# Patient Record
Sex: Male | Born: 1975 | Race: White | Hispanic: Yes | Marital: Married | Smoking: Former smoker
Health system: Southern US, Community
[De-identification: ages and names within clinical notes are randomized; demographics above are authoritative.]

## PROBLEM LIST (undated history)

## (undated) DIAGNOSIS — Z9109 Other allergy status, other than to drugs and biological substances: Secondary | ICD-10-CM

## (undated) DIAGNOSIS — I10 Essential (primary) hypertension: Secondary | ICD-10-CM

## (undated) DIAGNOSIS — T7840XA Allergy, unspecified, initial encounter: Secondary | ICD-10-CM

## (undated) HISTORY — DX: Allergy, unspecified, initial encounter: T78.40XA

## (undated) HISTORY — PX: HERNIA REPAIR: SHX51

## (undated) HISTORY — DX: Essential (primary) hypertension: I10

---

## 2009-06-04 ENCOUNTER — Ambulatory Visit (HOSPITAL_COMMUNITY): Admission: RE | Admit: 2009-06-04 | Discharge: 2009-06-04 | Payer: Self-pay | Admitting: General Surgery

## 2010-10-21 LAB — BASIC METABOLIC PANEL
CO2: 28 mEq/L (ref 19–32)
Chloride: 102 mEq/L (ref 96–112)
Creatinine, Ser: 0.91 mg/dL (ref 0.4–1.5)
GFR calc Af Amer: >60 mL/min (ref >60)
Potassium: 4.1 mEq/L (ref 3.5–5.1)

## 2010-10-21 LAB — CBC
HCT: 47.7 % (ref 39.0–52.0)
MCHC: 35.2 g/dL (ref 30.0–36.0)
MCV: 88.4 fL (ref 78.0–100.0)
RBC: 5.39 MIL/uL (ref 4.22–5.81)
WBC: 6.1 10*3/uL (ref 4.0–10.5)

## 2010-10-21 LAB — DIFFERENTIAL
Basophils Relative: 0 % (ref 0–1)
Eosinophils Absolute: 0.1 10*3/uL (ref 0.0–0.7)
Eosinophils Relative: 2 % (ref 0–5)
Lymphs Abs: 2 10*3/uL (ref 0.7–4.0)
Monocytes Absolute: 0.4 10*3/uL (ref 0.1–1.0)
Monocytes Relative: 6 % (ref 3–12)
Neutrophils Relative %: 60 % (ref 43–77)

## 2011-11-10 ENCOUNTER — Ambulatory Visit: Payer: Self-pay | Admitting: Family Medicine

## 2011-11-10 VITALS — BP 126/73 | HR 67 | Temp 98.5°F | Resp 16 | Ht 71.5 in | Wt 212.0 lb

## 2011-11-10 DIAGNOSIS — M545 Low back pain: Secondary | ICD-10-CM

## 2011-11-10 DIAGNOSIS — R300 Dysuria: Secondary | ICD-10-CM

## 2011-11-10 LAB — POCT UA - MICROSCOPIC ONLY
Bacteria, U Microscopic: NEGATIVE
Crystals, Ur, HPF, POC: NEGATIVE
Epithelial cells, urine per micros: NEGATIVE
RBC, urine, microscopic: NEGATIVE

## 2011-11-10 LAB — POCT URINALYSIS DIPSTICK
Bilirubin, UA: NEGATIVE
Blood, UA: NEGATIVE
Glucose, UA: NEGATIVE
Ketones, UA: NEGATIVE
Spec Grav, UA: 1.01
Urobilinogen, UA: 0.2

## 2011-11-10 MED ORDER — MELOXICAM 7.5 MG PO TABS: 7.5000 mg | ORAL_TABLET | Freq: Two times a day (BID) | ORAL | Status: AC

## 2011-11-10 MED ORDER — DOXYCYCLINE HYCLATE 100 MG PO TABS: 100.0000 mg | ORAL_TABLET | Freq: Two times a day (BID) | ORAL | Status: AC

## 2011-11-10 NOTE — Progress Notes (Signed)
  Subjective:    Patient ID: Seth Harris, male    DOB: [birth year], 36 y.o.   MRN: [id number]  HPI Two week history of lower back pain Pain at end of urine stream Urinating about 1-2 episodes an hour  Denies fever/chills or N/V  No history of nephrolithiasis, prostatitis  Monogamous; denies new sexual partners Denies penile discharge  Review of Systems     Objective:   Physical Exam  Constitutional: He appears well-developed.  Neck: Neck supple.  Cardiovascular: Normal rate, regular rhythm and normal heart sounds.   Pulmonary/Chest: Effort normal and breath sounds normal.  Abdominal: There is tenderness in the suprapubic area. There is no CVA tenderness.  Neurological: He is alert.      Results for orders placed in visit on 11/10/11  POCT URINALYSIS DIPSTICK      Component Value Range   Color, UA yellow     Clarity, UA clear     Glucose, UA neg     Bilirubin, UA neg     Ketones, UA neg     Spec Grav, UA 1.010     Blood, UA neg     pH, UA 5.5     Protein, UA neg     Urobilinogen, UA 0.2     Nitrite, UA neg     Leukocytes, UA Negative    POCT UA - MICROSCOPIC ONLY      Component Value Range   WBC, Ur, HPF, POC 0-1     RBC, urine, microscopic neg     Bacteria, U Microscopic neg     Mucus, UA neg     Epithelial cells, urine per micros neg     Crystals, Ur, HPF, POC neg     Casts, Ur, LPF, POC neg     Yeast, UA neg         Assessment & Plan:   1. Dysuria  POCT urinalysis dipstick, POCT UA - Microscopic Only, doxycycline (VIBRA-TABS) 100 MG tablet  2. LBP (low back pain)  meloxicam (MOBIC) 7.5 MG tablet   Niece provides translation

## 2012-06-17 ENCOUNTER — Ambulatory Visit: Payer: Self-pay | Admitting: Family Medicine

## 2012-06-17 VITALS — BP 126/74 | HR 68 | Temp 98.6°F | Resp 17 | Ht 72.0 in | Wt 210.0 lb

## 2012-06-17 DIAGNOSIS — Z0000 Encounter for general adult medical examination without abnormal findings: Secondary | ICD-10-CM

## 2012-06-17 DIAGNOSIS — R1011 Right upper quadrant pain: Secondary | ICD-10-CM

## 2012-06-17 LAB — LIPID PANEL
Cholesterol: 150 mg/dL (ref 0–200)
HDL: 34 mg/dL — ABNORMAL LOW (ref >39)
LDL Cholesterol: 71 mg/dL (ref 0–99)
Total CHOL/HDL Ratio: 4.4 Ratio
Triglycerides: 223 mg/dL — ABNORMAL HIGH (ref <150)
VLDL: 45 mg/dL — ABNORMAL HIGH (ref 0–40)

## 2012-06-17 LAB — POCT CBC
Granulocyte percent: 58.7 %G (ref 37–80)
HCT, POC: 55.3 % — AB (ref 43.5–53.7)
Hemoglobin: 17.7 g/dL (ref 14.1–18.1)
Lymph, poc: 1.8 (ref 0.6–3.4)
MCH, POC: 29.5 pg (ref 27–31.2)
MCHC: 32 g/dL (ref 31.8–35.4)
MCV: 92.4 fL (ref 80–97)
MID (cbc): 0.3 (ref 0–0.9)
MPV: 10.8 fL (ref 0–99.8)
POC Granulocyte: 3.1 (ref 2–6.9)
POC LYMPH PERCENT: 34.7 %L (ref 10–50)
POC MID %: 6.6 %M (ref 0–12)
Platelet Count, POC: 188 10*3/uL (ref 142–424)
RBC: 5.99 M/uL (ref 4.69–6.13)
RDW, POC: 12.9 %
WBC: 5.3 10*3/uL (ref 4.6–10.2)

## 2012-06-17 LAB — POCT URINALYSIS DIPSTICK
Bilirubin, UA: NEGATIVE
Blood, UA: NEGATIVE
Glucose, UA: NEGATIVE
Ketones, UA: NEGATIVE
Leukocytes, UA: NEGATIVE
Nitrite, UA: NEGATIVE
Protein, UA: NEGATIVE
Spec Grav, UA: 1.025
Urobilinogen, UA: 0.2
pH, UA: 5.5

## 2012-06-17 LAB — COMPREHENSIVE METABOLIC PANEL
ALT: 22 U/L (ref 0–53)
AST: 18 U/L (ref 0–37)
Albumin: 4.9 g/dL (ref 3.5–5.2)
Alkaline Phosphatase: 51 U/L (ref 39–117)
BUN: 12 mg/dL (ref 6–23)
CO2: 28 mEq/L (ref 19–32)
Calcium: 9.6 mg/dL (ref 8.4–10.5)
Chloride: 103 mEq/L (ref 96–112)
Creat: 0.9 mg/dL (ref 0.50–1.35)
Glucose, Bld: 98 mg/dL (ref 70–99)
Potassium: 4.3 mEq/L (ref 3.5–5.3)
Sodium: 138 mEq/L (ref 135–145)
Total Bilirubin: 0.8 mg/dL (ref 0.3–1.2)
Total Protein: 7.8 g/dL (ref 6.0–8.3)

## 2012-06-17 MED ORDER — RANITIDINE HCL 150 MG PO TABS: 150.0000 mg | ORAL_TABLET | Freq: Two times a day (BID) | ORAL | Status: DC

## 2012-06-17 NOTE — Patient Instructions (Addendum)
Colecistitis  (Cholecystitis)  La colecistitis es la inflamacin de la vescula biliar. Generalmente la causa es la formacin de clculos biliares o sedimentos (colelitiasis)) en la vescula. La vescula almacena un lquido que ayuda a digerir las grasas (bilis). La colecistitis es una enfermedad grave y requiere tratamiento inmediato.  CAUSAS   Clculos biliares. Los clculos biliares pueden obstruir el conducto que conduce a la vescula biliar, causando la acumulacin de bilis. Cuando la bilis se acumula, la vescula biliar se inflama.  Problemas en el conducto biliar, como la obstruccin por cicatrizacin o torsin.  Tumores. Los tumores pueden impedir que la bilis salga de la vescula adecuadamente, causando la acumulacin de la misma. Cuando la bilis se acumula, la vescula se inflama. SNTOMAS   Nuseas  Vmitos.  Dolor abdominal, especialmente en la zona superior derecha del abdomen.  Sensibilidad o hinchazn abdominal.  Sudoracin.  Escalofros.  Grant Ruts.  Color amarillo de la piel y en la zona blanca del ojo (ictericia). DIAGNSTICO  Su mdico puede indicar exmenes de sangre para Engineer, manufacturing una infeccin o problemas en la vescula biliar. Tambin puede ordenar pruebas de diagnstico por imgenes, como ecografas o tomografa computada (CT). Otras pruebas pueden incluir un estudio de gammagrafa hepatobiliar con cido iminodiactico (HIDA). Esta exploracin permite a su mdico ver el paso de la bilis desde el hgado hasta la vescula biliar y el intestino delgado.  TRATAMIENTO  La hospitalizacin suele ser necesaria para disminuir la inflamacin de la vescula biliar. Posiblemente le indiquen que no coma ni beba nada (ayuno) durante cierto perodo de [place]. Podrn indicarle un medicamento para calmar el dolor o un antibitico para tratar la infeccin. Puede ser necesario realizar Neomia Dear ciruga para extirpar la vescula biliar (colecistectoma) cuando la inflamacin haya disminudo.  Podra necesitar de inmediato una ciruga si aparecen complicaciones como la muerte del tejido de la vescula biliar (gangrena) o la ruptura (perforacin)) de la vescula biliar.  INSTRUCCIONES PARA EL CUIDADO EN EL HOGAR  El cuidado en el hogar depender del tipo de [place]. En general:   Si le han recetado antibiticos, tmelos segn las indicaciones. Tmelos todos, aunque se sienta mejor.  Solo tome medicamentos de venta libre o recetados para Chief Technology Officer, Dentist o [place], segn las indicaciones del mdico.  Siga una dieta baja en grasas hasta que vuelva a ver al mdico nuevamente.  Cumpla con todas las visitas de control, segn le indique su mdico. SOLICITE ATENCIN MDICA DE INMEDIATO SI:   El dolor aumenta y no puede controlarlo con los medicamentos.  El dolor se traslada hacia alguna otra zona del abdomen o hacia la espalda.  Tiene fiebre.  Tiene nuseas o vmitos. ASEGRESE DE QUE:   Comprende estas instrucciones.  Controlar su enfermedad.  Solicitar ayuda de inmediato si no mejora o si empeora. Document Released: 04/14/2005 Document Revised: 09/27/2011 [facility] Patient Information 2013 [place], [place].

## 2012-06-17 NOTE — Progress Notes (Signed)
@[email or web address] @  Patient ID: Seth Harris MRN: [id number], DOB: 1976-04-01 36 y.o. Date of Encounter: 06/17/2012, 11:10 AM  Primary Physician: No primary provider on file.  Chief Complaint: Physical (CPE)  HPI: 36 y.o. y/o male with history noted below here for CPE.  Doing well.   Review of Systems: Consitutional: No fever, chills, fatigue, night sweats, lymphadenopathy, or weight changes. Eyes: No visual changes, eye redness, or discharge. ENT/Mouth: Ears: No otalgia, tinnitus, hearing loss, discharge. Nose: No congestion, rhinorrhea, sinus pain, or epistaxis. Throat: No sore throat, post nasal drip, or teeth pain. Cardiovascular: No CP, palpitations, diaphoresis, DOE, edema, orthopnea, PND. Respiratory: No cough, hemoptysis, SOB, or wheezing. Gastrointestinal: No anorexia, dysphagia, reflux, nausea, vomiting, hematemesis, diarrhea, constipation, BRBPR, or melena.  He does have 5 days of RUQ pain associated with eating Genitourinary: No dysuria, frequency, urgency, hematuria, incontinence, nocturia, decreased urinary stream, discharge, impotence, or testicular pain/masses. Musculoskeletal: No decreased ROM, myalgias, stiffness, joint swelling, or weakness. Skin: No rash, erythema, lesion changes, pain, warmth, jaundice, or pruritis. Neurological: No headache, dizziness, syncope, seizures, tremors, memory loss, coordination problems, or paresthesias. Psychological: No anxiety, depression, hallucinations, SI/HI. Endocrine: No fatigue, polydipsia, polyphagia, polyuria, or known diabetes. All other systems were reviewed and are otherwise negative.  Past Medical History  Diagnosis Date  . Allergy      Past Surgical History  Procedure Date  . Hernia repair     Home Meds:  Prior to Admission medications   Medication Sig Start Date End Date Taking? Authorizing Provider  meloxicam (MOBIC) 7.5 MG tablet Take 1 tablet (7.5 mg total) by mouth 2 (two) times daily. 11/10/11 11/09/12  Dois Davenport, MD    Allergies: No Known Allergies  History   Social History  . Marital Status: Married    Spouse Name: N/A    Number of Children: N/A  . Years of Education: N/A   Occupational History  . Not on file.   Social History Main Topics  . Smoking status: Former Smoker    Types: Cigarettes    Quit date: 11/09/2001  . Smokeless tobacco: Not on file  . Alcohol Use: No  . Drug Use: No  . Sexually Active: Yes    Birth Control/ Protection: None   Other Topics Concern  . Not on file   Social History Narrative  . No narrative on file    Family History  Problem Relation Age of Onset  . Diabetes Mother   . Diabetes Brother     Physical Exam: Blood pressure 126/74, pulse 68, temperature 98.6 F (37 C), temperature source Oral, resp. rate 17, height 6' (1.829 m), weight 210 lb (95.255 kg), SpO2 99.00%.  General: Well developed, well nourished, in no acute distress. HEENT: Normocephalic, atraumatic. Conjunctiva pink, sclera non-icteric. Pupils 2 mm constricting to 1 mm, round, regular, and equally reactive to light and accomodation. EOMI. Internal auditory canal clear. TMs with good cone of light and without pathology. Nasal mucosa pink. Nares are without discharge. No sinus tenderness. Oral mucosa pink. Dentition good. Pharynx without exudate.   Neck: Supple. Trachea midline. No thyromegaly. Full ROM. No lymphadenopathy. Lungs: Clear to auscultation bilaterally without wheezes, rales, or rhonchi. Breathing is of normal effort and unlabored. Cardiovascular: RRR with S1 S2. No murmurs, rubs, or gallops appreciated. Distal pulses 2+ symmetrically. No carotid or abdominal bruits Abdomen: Soft, non-tender, non-distended with normoactive bowel sounds. No hepatosplenomegaly or masses. No rebound/guarding. No CVA tenderness. Without hernias.   Genitourinary:  uncircumcised male. No penile lesions. Testes  descended bilaterally, and smooth without tenderness or masses.    Musculoskeletal: Full range of motion and 5/5 strength throughout. Without swelling, atrophy, tenderness, crepitus, or warmth. Extremities without clubbing, cyanosis, or edema. Calves supple. Skin: Warm and moist without erythema, ecchymosis, wounds, or rash. Neuro: A+Ox3. CN II-XII grossly intact. Moves all extremities spontaneously. Full sensation throughout. Normal gait. DTR 2+ throughout upper and lower extremities. Finger to nose intact. Psych:  Responds to questions appropriately with a normal affect.   UA:   Assessment/Plan:  36 y.o. y/o  male here for CPE -  Signed, Elvina Sidle, MD 06/17/2012 11:10 AM

## 2012-06-18 ENCOUNTER — Telehealth: Payer: Self-pay

## 2012-06-18 NOTE — Telephone Encounter (Signed)
Pt does not speak english, his brother Elita Quick is calling ion his behalf. The brother states someone called patient, but he does not understand what was said. Please call jose martinez to advise

## 2012-06-19 ENCOUNTER — Telehealth: Payer: Self-pay

## 2012-06-19 MED ORDER — TRAMADOL HCL 50 MG PO TABS: ORAL_TABLET | ORAL | Status: DC

## 2012-06-19 NOTE — Telephone Encounter (Signed)
Sent in Rx and notified pt. 

## 2012-06-19 NOTE — Telephone Encounter (Signed)
Please call in Tramadol 50 #[phone] one tid prn

## 2012-06-19 NOTE — Telephone Encounter (Signed)
Message copied by Johnnette Litter on Mon Jun 19, 2012  2:58 PM ------      Message from: Elvina Sidle      Created: Mon Jun 19, 2012  2:51 PM       Please inform patient of normal result

## 2012-06-19 NOTE — Telephone Encounter (Signed)
Spoke with patients boss(patient doesn't speak english). He is improving some but still has pain would like pain medicine

## 2012-06-19 NOTE — Telephone Encounter (Signed)
See notes under lab results. 

## 2013-02-18 ENCOUNTER — Ambulatory Visit: Payer: Self-pay | Admitting: Family Medicine

## 2013-02-18 VITALS — BP 108/72 | HR 68 | Temp 98.1°F | Resp 16 | Ht 72.0 in | Wt 176.0 lb

## 2013-02-18 DIAGNOSIS — R300 Dysuria: Secondary | ICD-10-CM

## 2013-02-18 LAB — POCT UA - MICROSCOPIC ONLY
Casts, Ur, LPF, POC: NEGATIVE
Crystals, Ur, HPF, POC: NEGATIVE
Mucus, UA: NEGATIVE

## 2013-02-18 LAB — POCT CBC
HCT, POC: 49.5 % (ref 43.5–53.7)
Hemoglobin: 16 g/dL (ref 14.1–18.1)
MCH, POC: 30.8 pg (ref 27–31.2)
MCV: 95.4 fL (ref 80–97)
MPV: 10.4 fL (ref 0–99.8)
RBC: 5.19 M/uL (ref 4.69–6.13)
WBC: 4.8 10*3/uL (ref 4.6–10.2)

## 2013-02-18 LAB — POCT URINALYSIS DIPSTICK
Blood, UA: NEGATIVE
Glucose, UA: NEGATIVE
Spec Grav, UA: <=1.005
Urobilinogen, UA: 0.2

## 2013-02-18 MED ORDER — CIPROFLOXACIN HCL 500 MG PO TABS: 500.0000 mg | ORAL_TABLET | Freq: Two times a day (BID) | ORAL | Status: DC

## 2013-02-18 NOTE — Patient Instructions (Addendum)
We will be in touch with the rest of your labs. Use the antibiotic as directed.  Let me know if you are getting worse.  Your platelets are slightly low- please have a recheck blood count in the next few months.

## 2013-02-18 NOTE — Progress Notes (Addendum)
[facility] [address], [place] [address] [phone]  Date:  02/18/2013   Name:  Seth Harris   DOB:  1976-02-07   MRN:  [id number]  PCP:  No PCP Per Patient    Chief Complaint: Abdominal Pain and Back Pain   History of Present Illness:  Seth Harris is a 37 y.o. very pleasant male patient who presents with the following:  He notes lower abdominal pain and dysuria.  This occurred 2 months ago but resolved by taking some "healthy pills," some sort of OTC supplement.  However the sx returned 3 weeks ago.  He does not have any penile discharge, no nausea or vomiting. He does have an ache in his lower back bilaterally   He has a history of bilateral inguinal hernias and an umbilical hernia- all have been repaired in the past.  He also notes that one of his testicles "has a little extra meat on it" and he is not sure if this is normal.  He is not sure how long this has been going on.   He is otherwise generally healthy.  No current medications, NKDA  There are no active problems to display for this patient.   Past Medical History  Diagnosis Date  . Allergy     Past Surgical History  Procedure Laterality Date  . Hernia repair      History  Substance Use Topics  . Smoking status: Former Smoker    Types: Cigarettes    Quit date: 11/09/2001  . Smokeless tobacco: Not on file  . Alcohol Use: No    Family History  Problem Relation Age of Onset  . Diabetes Mother   . Diabetes Brother     No Known Allergies  Medication list has been reviewed and updated.  Current Outpatient Prescriptions on File Prior to Visit  Medication Sig Dispense Refill  . ranitidine (ZANTAC) 150 MG tablet Take 1 tablet (150 mg total) by mouth 2 (two) times daily.  30 tablet  3  . traMADol (ULTRAM) 50 MG tablet Take one tablet by mouth three times daily as needed for pain.  21 tablet  0   No current facility-administered medications on file prior to visit.    Review  of Systems:  As per HPI- otherwise negative.   Physical Examination: Filed Vitals:   02/18/13 0929  BP: 108/72  Pulse: 68  Temp: 98.1 F (36.7 C)  Resp: 16   Filed Vitals:   02/18/13 0929  Height: 6' (1.829 m)  Weight: 176 lb (79.833 kg)   Body mass index is 23.86 kg/(m^2). Ideal Body Weight: Weight in (lb) to have BMI = 25: 183.9  GEN: WDWN, NAD, Non-toxic, A & O x 3 HEENT: Atraumatic, Normocephalic. Neck supple. No masses, No LAD. Ears and Nose: No external deformity. CV: RRR, No M/G/R. No JVD. No thrill. No extra heart sounds. PULM: CTA B, no wheezes, crackles, rhonchi. No retractions. No resp. distress. No accessory muscle use. ABD: S, NT, ND, +BS. No rebound. No HSM.  Abdomen completely benign at this time EXTR: No c/c/e NEURO Normal gait.  PSYCH: Normally interactive. Conversant. Not depressed or anxious appearing.  Calm demeanor.  GU: normal, no discharge, lesions or tenderness, no current hernia.  I do not detect any testicular abnormality  Rectal: normal, no prostate enlargement or tenderness  Results for orders placed in visit on 02/18/13  POCT CBC      Result Value Range   WBC 4.8  4.6 - 10.2 K/uL   Lymph, poc 1.7  0.6 - 3.4   POC LYMPH PERCENT 36.2  10 - 50 %L   MID (cbc) 0.3  0 - 0.9   POC MID % 5.8  0 - 12 %M   POC Granulocyte 2.8  2 - 6.9   Granulocyte percent 58.0  37 - 80 %G   RBC 5.19  4.69 - 6.13 M/uL   Hemoglobin 16.0  14.1 - 18.1 g/dL   HCT, POC 40.9  81.1 - 53.7 %   MCV 95.4  80 - 97 fL   MCH, POC 30.8  27 - 31.2 pg   MCHC 32.3  31.8 - 35.4 g/dL   RDW, POC 91.4     Platelet Count, POC 125 (*) 142 - 424 K/uL   MPV 10.4  0 - 99.8 fL  POCT UA - MICROSCOPIC ONLY      Result Value Range   WBC, Ur, HPF, POC negative     RBC, urine, microscopic 0-2     Bacteria, U Microscopic trace     Mucus, UA negative     Epithelial cells, urine per micros 0-1     Crystals, Ur, HPF, POC negative     Casts, Ur, LPF, POC negative     Yeast, UA negative     POCT URINALYSIS DIPSTICK      Result Value Range   Color, UA yellow     Clarity, UA clear     Glucose, UA negative     Bilirubin, UA negative     Ketones, UA negative     Spec Grav, UA <=1.005     Blood, UA negative     pH, UA 6.0     Protein, UA negative     Urobilinogen, UA 0.2     Nitrite, UA negative     Leukocytes, UA Negative      Assessment and Plan: Dysuria - Plan: POCT CBC, POCT UA - Microscopic Only, POCT urinalysis dipstick, Urine culture, GC/Chlamydia Probe Amp, ciprofloxacin (CIPRO) 500 MG tablet  Non- specific sx of dysuria for some time, and a testicular concern.  Await genprobe and urine culture, treat with cipro.  Advised pt that I do not note anything abnormal, but if he notes a change we should do a testicular ultrasound.  At this time he declines the ultrasound, but will let me know if he decided to proceed with this.  He is not sure if there is a change, or if he just never noticed the way his testicles felt in the past.    Signed Abbe Amsterdam, MD  8/12- called to check on pt.  LMOM in spanish- labs look ok, please let me know if any problems persist. I will mail a letter with his labs.

## 2013-02-19 LAB — URINE CULTURE: Organism ID, Bacteria: NO GROWTH

## 2013-02-19 LAB — GC/CHLAMYDIA PROBE AMP: CT Probe RNA: NEGATIVE

## 2013-02-27 ENCOUNTER — Encounter: Payer: Self-pay | Admitting: Family Medicine

## 2013-09-04 ENCOUNTER — Ambulatory Visit: Payer: Self-pay

## 2013-09-05 ENCOUNTER — Ambulatory Visit: Payer: Self-pay

## 2013-09-05 ENCOUNTER — Ambulatory Visit: Payer: Self-pay | Admitting: Internal Medicine

## 2013-09-05 ENCOUNTER — Ambulatory Visit: Payer: No Typology Code available for payment source | Attending: Internal Medicine

## 2013-09-05 VITALS — BP 122/70 | HR 73 | Temp 97.9°F | Resp 16 | Ht 71.0 in | Wt 170.4 lb

## 2013-09-05 DIAGNOSIS — M62830 Muscle spasm of back: Secondary | ICD-10-CM

## 2013-09-05 DIAGNOSIS — M7918 Myalgia, other site: Secondary | ICD-10-CM

## 2013-09-05 DIAGNOSIS — M549 Dorsalgia, unspecified: Secondary | ICD-10-CM

## 2013-09-05 DIAGNOSIS — K429 Umbilical hernia without obstruction or gangrene: Secondary | ICD-10-CM

## 2013-09-05 DIAGNOSIS — IMO0001 Reserved for inherently not codable concepts without codable children: Secondary | ICD-10-CM

## 2013-09-05 DIAGNOSIS — M5380 Other specified dorsopathies, site unspecified: Secondary | ICD-10-CM

## 2013-09-05 DIAGNOSIS — K4020 Bilateral inguinal hernia, without obstruction or gangrene, not specified as recurrent: Secondary | ICD-10-CM

## 2013-09-05 LAB — POCT UA - MICROSCOPIC ONLY
BACTERIA, U MICROSCOPIC: NEGATIVE
CASTS, UR, LPF, POC: NEGATIVE
CRYSTALS, UR, HPF, POC: NEGATIVE
MUCUS UA: NEGATIVE
RBC, urine, microscopic: NEGATIVE
WBC, Ur, HPF, POC: NEGATIVE
YEAST UA: NEGATIVE

## 2013-09-05 LAB — POCT URINALYSIS DIPSTICK
Bilirubin, UA: NEGATIVE
GLUCOSE UA: NEGATIVE
Ketones, UA: NEGATIVE
Leukocytes, UA: NEGATIVE
NITRITE UA: NEGATIVE
PH UA: 5.5
PROTEIN UA: NEGATIVE
RBC UA: NEGATIVE
UROBILINOGEN UA: 0.2

## 2013-09-05 MED ORDER — CYCLOBENZAPRINE HCL 10 MG PO TABS: 10.0000 mg | ORAL_TABLET | Freq: Three times a day (TID) | ORAL | Status: DC | PRN

## 2013-09-05 MED ORDER — TRAMADOL HCL 50 MG PO TABS: 50.0000 mg | ORAL_TABLET | Freq: Three times a day (TID) | ORAL | Status: DC | PRN

## 2013-09-05 NOTE — Progress Notes (Signed)
   Subjective:    Patient ID: Seth ForehandMarco Harris, male    DOB: 08/21/1975, 38 y.o.   MRN: [id number]  HPI  Moderately sever Back pain onset 2 weeks ago after work, no known injury, pain 6/10 increases with motion, no radiation to legs but does radiate to both sides of the back , is mostly  Midline in the lower back. Does not lift anything heavy at work. Also c/o some increased pain in bilateral groin hernias which he has had for 7 weeks adn also some increased discomfort in area of an umbilical hernia that he had surgery to repain 18 years ago.   Review of Systems  Musculoskeletal: Positive for back pain and myalgias. Negative for arthralgias, gait problem, joint swelling, neck pain and neck stiffness.  All other systems reviewed and are negative.       Objective:   Physical Exam  Nursing note and vitals reviewed. Constitutional: He is oriented to person, place, and time. He appears well-developed and well-nourished.  HENT:  Head: Normocephalic and atraumatic.  Right Ear: External ear normal.  Left Ear: External ear normal.  Eyes: Conjunctivae and EOM are normal. Pupils are equal, round, and reactive to light.  Neck: Normal range of motion. Neck supple.  Cardiovascular: Normal rate, normal heart sounds and intact distal pulses.   Pulmonary/Chest: Effort normal.  Abdominal: Soft. Bowel sounds are normal.  bilateral easily reducible inguinal hernias below the surgical scars. Small umbilical hernia appreciated. Easily reducible.   Musculoskeletal: He exhibits tenderness.  Tender upper lumbar spine with muscle spasm bilaterally to l1 l2 area. Normal rom of lower extremities. dtr normal neg slr test.  Neurological: He is alert and oriented to person, place, and time. He has normal reflexes.  Skin: Skin is warm and dry.  Psychiatric: He has a normal mood and affect. His behavior is normal. Judgment and thought content normal.   [facility] reading (PRIMARY) by  Dr. Mindi JunkerGottlieb negitive lumbosacral spine  xray.Marland Kitchen.    Results for orders placed in visit on 09/05/13  POCT UA - MICROSCOPIC ONLY      Result Value Ref Range   WBC, Ur, HPF, POC neg     RBC, urine, microscopic neg     Bacteria, U Microscopic neg     Mucus, UA neg     Epithelial cells, urine per micros 0-1     Crystals, Ur, HPF, POC neg     Casts, Ur, LPF, POC neg     Yeast, UA neg    POCT URINALYSIS DIPSTICK      Result Value Ref Range   Color, UA yellow     Clarity, UA clear     Glucose, UA neg     Bilirubin, UA neg     Ketones, UA neg     Spec Grav, UA <=1.005     Blood, UA neg     pH, UA 5.5     Protein, UA neg     Urobilinogen, UA 0.2     Nitrite, UA neg     Leukocytes, UA Negative     vi    Assessment & Plan:  Pt has bilateral inguinal and small umbilical hernia. Does not want to be referred to surgery at this time. The xray of the back is normal will rx wiht analgesics and muscle relaxant for back pain. No heavy lifting .

## 2013-09-05 NOTE — Patient Instructions (Signed)
Ultram as directed for pain. Flexeril as directed for muscle spasm. No lifting more than 20 pounds. Surgical evaluation for repair of the hernias. Any problem or increased pain return to the office.

## 2013-09-20 ENCOUNTER — Encounter: Payer: Self-pay | Admitting: Family Medicine

## 2013-09-20 ENCOUNTER — Ambulatory Visit: Payer: No Typology Code available for payment source | Attending: Family Medicine | Admitting: Family Medicine

## 2013-09-20 VITALS — BP 115/73 | HR 91 | Temp 98.7°F | Resp 14 | Ht 71.0 in | Wt 174.6 lb

## 2013-09-20 DIAGNOSIS — S39012A Strain of muscle, fascia and tendon of lower back, initial encounter: Secondary | ICD-10-CM

## 2013-09-20 DIAGNOSIS — IMO0002 Reserved for concepts with insufficient information to code with codable children: Secondary | ICD-10-CM | POA: Insufficient documentation

## 2013-09-20 DIAGNOSIS — X58XXXA Exposure to other specified factors, initial encounter: Secondary | ICD-10-CM | POA: Insufficient documentation

## 2013-09-20 MED ORDER — MELOXICAM 7.5 MG PO TABS: ORAL_TABLET | ORAL | Status: DC

## 2013-09-20 NOTE — Progress Notes (Signed)
**Note Seth-Identified via Obfuscation** Subjective:     Patient ID: Seth Harris, male   DOB: 04/19/1976, 38 y.o.   MRN: [id number]  HPI Seth Harris is a 38 year old male that presents with back pain. His back pain started 1 month ago. He states he injured his back at work and does repetetive movements that aggravates his pain. His pain starts in the left side of his back and radiates to the left side. He says it is aggravated by walking and he describes the pain as pulling with no relieving factors. It is worse at night and he rates it as a 5-6 on a scale of 0-10. He has been taking ultram and flexeril with no pain relief. No saddle parastheias or l9oss of bladder/bowel fxn.  Review of Systems The patient denies any other issues at this time.    Objective:   Physical Exam Nursing note and vitals reviewed. Constitutional: He is oriented to person, place, and time. He  appears well-developed and well-nourished.  HENT:  Right Ear: External ear normal.  Left Ear: External ear normal.  Nose: Nose normal.  Mouth/Throat: Oropharynx is clear and moist. No oropharyngeal exudate.  Eyes: Conjunctivae are normal. Pupils are equal, round, and reactive to light.  Neck: Normal range of motion. Neck supple. No thyromegaly present.  Cardiovascular: Normal rate, regular rhythm and normal heart sounds.   Pulmonary/Chest: Effort normal and breath sounds normal.  Abdominal: Soft. Bowel sounds are normal.  no distension. There is no tenderness. There is no rebound.  Lymphadenopathy:    He has no cervical adenopathy.  Neurological: He is alert and oriented to person, place, and time. He has normal reflexes.  Skin: Skin is warm and dry.He has no concerning moles or skin lesions Psychiatric: He has a normal mood and affect. His behavior is normal.  msk - SLR neg b/l Low back tight muscles in lumbar area, decreased rom of back.     Assessment:     strain     Plan:       Seth Harris was seen today for establish care.  Diagnoses and associated  orders for this visit:  Back strain - Discontinue: meloxicam (MOBIC) 7.5 MG tablet; 1-2 tabs by mouth daily - meloxicam (MOBIC) 7.5 MG tablet; 1-2 tabs by mouth daily Exercises given. Expect up to 8 weeks to recover. Rtc/ED i,mmediat;y if worsens with red flag sx. F/u as scheduled

## 2013-09-20 NOTE — Progress Notes (Signed)
Patient is here to establish care. Patient feels that her may have bilateral hernia's. Complains of lower back pain from Rt to Lt. Pain scale of 3 today. Was told that he has a muscular pain, pain has not ended. Needs a medication refill. Bitter taste in patient's mouth x2 days. Patient has an interpreter.

## 2013-09-20 NOTE — Patient Instructions (Signed)
Ejercicios para la espalda  (Back Exercises)  Estos ejercicios ayudan a tratar y prevenir lesiones en la espalda. El objetivo es aumentar la fuerza de los músculos abdominales y dorsales y la flexibilidad de la espalda. Debe comenzar con estos ejercicios cuando ya no tenga dolor. Los ejercicios para la espalda incluyen:  · Inclinación de la pelvis - Recuéstese sobre la espalda con las rodillas flexionadas. Incline la pelvis hasta que la parte inferior de la espalda se apoye en el piso. Mantenga esta posición durante 5 a 10 segundos y repita entre 5 y 10 veces.  · Rodilla al pecho  Empuje primero una rodilla contra el pecho y mantenga durante 20 a 30 segundos; repita con la otra rodilla y luego con ambas a la vez. Esto puede realizarlo con la otra pierna extendida o flexionada, del modo en que se sienta más cómodo.  · Abdominales o despegar el cóccix del suelo empleando la musculatura abdominal  Flexione las rodillas 90 grados. Comience inclinando la pelvis y realice un ejercicio abdominal lento y parcial, elevando el tronco sólo entre 30 y 45 grados del suelo. Emplee al menos entre 2 y 3 segundos para cada abdominal. No realice los abdominales con las rodillas extendidas. Si le resulta difícil realizar abdominales parciales, simplemente haga lo que se explicó anteriormente, pero sólo contraiga los músculos abdominales y manténgalos tal como se le ha indicado.  · Inclinación de la cadera - Recuéstese sobre la espalda con las rodillas flexionadas a 90 grados. Empújese con los pies y los hombros mientras eleva la cadera un par de centímetros del suelo, mantenga durante 10 segundos y repita entre 5 y 10 veces.  · Arcos dorsales  Acuéstese sobre el estómago e impulse el tronco hacia atrás sobre los codos flexionados. Presione lentamente con las manos, formando un arco con la zona inferior de la espalda. Repita entre 3 y 5 veces. Al realizar las repeticiones, luego de un tiempo disminuirán la rigidez y las  molestias.  · Elevación de los hombros  Acuéstese hacia abajo con los brazos a los lados del cuerpo. Presione las caderas y el torso contra el suelo mientras eleva lentamente la cabeza y los hombros del suelo.  No exagere con los ejercicios, especialmente en el comienzo. Los ejercicios pueden causar alguna molestia leve en la espalda durante algunos minutos; sin embargo, si el dolor es muy intenso, o dura más de 15 minutos, no siga con la actividad física hasta que consulte al profesional que lo asiste. Los problemas en la espalda mejoran de manera lenta con esta terapia.   Consulte al profesional para que lo ayude a planificar un programa de ejercicios adecuado para su espalda.  Document Released: 07/05/2005 Document Revised: 09/27/2011  [employer]® Patient Information ©2014 [employer].

## 2013-12-21 ENCOUNTER — Ambulatory Visit: Payer: No Typology Code available for payment source | Attending: Internal Medicine | Admitting: Internal Medicine

## 2013-12-21 ENCOUNTER — Encounter: Payer: Self-pay | Admitting: Internal Medicine

## 2013-12-21 VITALS — BP 103/68 | HR 75 | Temp 98.3°F | Resp 16 | Wt 174.8 lb

## 2013-12-21 DIAGNOSIS — R1011 Right upper quadrant pain: Secondary | ICD-10-CM | POA: Insufficient documentation

## 2013-12-21 DIAGNOSIS — Z87898 Personal history of other specified conditions: Secondary | ICD-10-CM | POA: Insufficient documentation

## 2013-12-21 DIAGNOSIS — R1030 Lower abdominal pain, unspecified: Secondary | ICD-10-CM | POA: Insufficient documentation

## 2013-12-21 DIAGNOSIS — Z9889 Other specified postprocedural states: Secondary | ICD-10-CM | POA: Insufficient documentation

## 2013-12-21 DIAGNOSIS — IMO0002 Reserved for concepts with insufficient information to code with codable children: Secondary | ICD-10-CM

## 2013-12-21 DIAGNOSIS — R1909 Other intra-abdominal and pelvic swelling, mass and lump: Secondary | ICD-10-CM

## 2013-12-21 DIAGNOSIS — R1900 Intra-abdominal and pelvic swelling, mass and lump, unspecified site: Secondary | ICD-10-CM

## 2013-12-21 DIAGNOSIS — S39012A Strain of muscle, fascia and tendon of lower back, initial encounter: Secondary | ICD-10-CM | POA: Insufficient documentation

## 2013-12-21 DIAGNOSIS — R109 Unspecified abdominal pain: Secondary | ICD-10-CM | POA: Insufficient documentation

## 2013-12-21 MED ORDER — IBUPROFEN 600 MG PO TABS: 600.0000 mg | ORAL_TABLET | Freq: Three times a day (TID) | ORAL | Status: DC | PRN

## 2013-12-21 MED ORDER — CYCLOBENZAPRINE HCL 10 MG PO TABS: 10.0000 mg | ORAL_TABLET | Freq: Every day | ORAL | Status: DC

## 2013-12-21 NOTE — Progress Notes (Signed)
Here with interpreter Complains of having some back pain Also has bilateral hernias to his groin area that he  re injured at work

## 2013-12-21 NOTE — Progress Notes (Signed)
MRN: [id number] Name: Seth Harris  Sex: male Age: 38 y.o. DOB: 03-05-1976  Allergies: Review of patient's allergies indicates no known allergies.  Chief Complaint  Patient presents with  . Hernia    HPI: Patient is 38 y.o. male who was seen in our office by Dr. Sherral Hammers  3 months ago with symptoms of lower back pain patient was prescribed MOBIC as per patient he did not take his medication because it caused  him problem, patient also has history of bilateral inguinal hernia and reported pain and noticed lump in the groin for the last 2 years, denies any change in bowel habits reported to have right upper pain on and off when he eats and is concerned about gallbladder stones, denies any fever chills nausea vomiting.  Past Medical History  Diagnosis Date  . Allergy     Past Surgical History  Procedure Laterality Date  . Hernia repair        Medication List       This list is accurate as of: 12/21/13 11:16 AM.  Always use your most recent med list.               cyclobenzaprine 10 MG tablet  Commonly known as:  FLEXERIL  Take 1 tablet (10 mg total) by mouth at bedtime.     ibuprofen 600 MG tablet  Commonly known as:  ADVIL,MOTRIN  Take 1 tablet (600 mg total) by mouth every 8 (eight) hours as needed.     meloxicam 7.5 MG tablet  Commonly known as:  MOBIC  1-2 tabs by mouth daily        Meds ordered this encounter  Medications  . cyclobenzaprine (FLEXERIL) 10 MG tablet    Sig: Take 1 tablet (10 mg total) by mouth at bedtime.    Dispense:  30 tablet    Refill:  1  . ibuprofen (ADVIL,MOTRIN) 600 MG tablet    Sig: Take 1 tablet (600 mg total) by mouth every 8 (eight) hours as needed.    Dispense:  30 tablet    Refill:  1     There is no immunization history on file for this patient.  Family History  Problem Relation Age of Onset  . Diabetes Mother   . Diabetes Brother     History  Substance Use Topics  . Smoking status: Former Smoker    Types:  Cigarettes    Quit date: 11/09/2001  . Smokeless tobacco: Not on file  . Alcohol Use: No    Review of Systems   As noted in HPI  Filed Vitals:   12/21/13 1046  BP: 103/68  Pulse: 75  Temp: 98.3 F (36.8 C)  Resp: 16    Physical Exam  Physical Exam  Constitutional: No distress.  Eyes: EOM are normal. Pupils are equal, round, and reactive to light.  Cardiovascular: Normal rate and regular rhythm.   Pulmonary/Chest: Breath sounds normal. No respiratory distress. He has no wheezes. He has no rales.  Abdominal:  Minimal bulge in bilateral inguinal region, old incision noticed    CBC    Component Value Date/Time   WBC 4.8 02/18/2013 1054   WBC 6.1 05/29/2009 1336   RBC 5.19 02/18/2013 1054   RBC 5.39 05/29/2009 1336   HGB 16.0 02/18/2013 1054   HGB 16.8 05/29/2009 1336   HCT 49.5 02/18/2013 1054   HCT 47.7 05/29/2009 1336   PLT 153 05/29/2009 1336   MCV 95.4 02/18/2013 1054   MCV 88.4  05/29/2009 1336   LYMPHSABS 2.0 05/29/2009 1336   MONOABS 0.4 05/29/2009 1336   EOSABS 0.1 05/29/2009 1336   BASOSABS 0.0 05/29/2009 1336    CMP     Component Value Date/Time   NA 138 06/17/2012 1130   K 4.3 06/17/2012 1130   CL 103 06/17/2012 1130   CO2 28 06/17/2012 1130   GLUCOSE 98 06/17/2012 1130   BUN 12 06/17/2012 1130   CREATININE 0.90 06/17/2012 1130   CREATININE 0.91 05/29/2009 1336   CALCIUM 9.6 06/17/2012 1130   PROT 7.8 06/17/2012 1130   ALBUMIN 4.9 06/17/2012 1130   AST 18 06/17/2012 1130   ALT 22 06/17/2012 1130   ALKPHOS 51 06/17/2012 1130   BILITOT 0.8 06/17/2012 1130   GFRNONAA >60 05/29/2009 1336   GFRAA  Value: >60        The eGFR has been calculated using the MDRD equation. This calculation has not been validated in all clinical situations. eGFR's persistently <60 mL/min signify possible Chronic Kidney Disease. 05/29/2009 1336    Lab Results  Component Value Date/Time   CHOL 150 06/17/2012 11:30 AM    No components found with this basename: hga1c     Lab Results  Component Value Date/Time   AST 18 06/17/2012 11:30 AM    Assessment and Plan  Back strain - Plan: I advised patient to apply heating pad trial of cyclobenzaprine (FLEXERIL) 10 MG tablet each bedtime, ibuprofen (ADVIL,MOTRIN) 600 MG tablet  Inguinal pain/Groin lump - Plan: Ambulatory referral to General Surgery for further evaluation  RUQ pain - Plan: US Abdomen Complete  Return in about 3 months (around 03/23/2014).  Lorayne Marek, MD

## 2013-12-27 ENCOUNTER — Ambulatory Visit (HOSPITAL_COMMUNITY)
Admission: RE | Admit: 2013-12-27 | Discharge: 2013-12-27 | Disposition: A | Discharge status: Home / Self Care | Payer: No Typology Code available for payment source | Source: Ambulatory Visit | Attending: Internal Medicine | Admitting: Internal Medicine

## 2013-12-27 ENCOUNTER — Telehealth: Payer: Self-pay

## 2013-12-27 DIAGNOSIS — R1011 Right upper quadrant pain: Principal | ICD-10-CM

## 2013-12-27 DIAGNOSIS — G8929 Other chronic pain: Secondary | ICD-10-CM

## 2013-12-27 DIAGNOSIS — N289 Disorder of kidney and ureter, unspecified: Secondary | ICD-10-CM | POA: Insufficient documentation

## 2013-12-27 NOTE — Telephone Encounter (Signed)
Interpreter line used Patient not available Left message on voice mail to return our call 

## 2013-12-27 NOTE — Telephone Encounter (Signed)
Message copied by Lestine Mount on Thu Dec 27, 2013 12:49 PM ------      Message from: Doris Cheadle      Created: Thu Dec 27, 2013 11:27 AM       Call and let the patient know that his ultrasound abdomen was negative for gallstones. ------

## 2013-12-27 NOTE — Telephone Encounter (Signed)
Patient stopped in office to get his [place] results Results were neg Still having pain Put referral in epic for GI

## 2013-12-31 DIAGNOSIS — K219 Gastro-esophageal reflux disease without esophagitis: Secondary | ICD-10-CM | POA: Insufficient documentation

## 2014-03-27 ENCOUNTER — Encounter: Payer: Self-pay | Admitting: Internal Medicine

## 2014-03-27 ENCOUNTER — Ambulatory Visit: Payer: Self-pay | Attending: Internal Medicine | Admitting: Internal Medicine

## 2014-03-27 VITALS — BP 124/74 | HR 66 | Temp 98.9°F | Ht 72.0 in | Wt 183.4 lb

## 2014-03-27 DIAGNOSIS — K3189 Other diseases of stomach and duodenum: Secondary | ICD-10-CM | POA: Insufficient documentation

## 2014-03-27 DIAGNOSIS — R1013 Epigastric pain: Secondary | ICD-10-CM | POA: Insufficient documentation

## 2014-03-27 DIAGNOSIS — M545 Low back pain, unspecified: Secondary | ICD-10-CM | POA: Insufficient documentation

## 2014-03-27 DIAGNOSIS — M62830 Muscle spasm of back: Secondary | ICD-10-CM

## 2014-03-27 DIAGNOSIS — M5380 Other specified dorsopathies, site unspecified: Secondary | ICD-10-CM | POA: Insufficient documentation

## 2014-03-27 DIAGNOSIS — Z0000 Encounter for general adult medical examination without abnormal findings: Secondary | ICD-10-CM | POA: Insufficient documentation

## 2014-03-27 DIAGNOSIS — Z87891 Personal history of nicotine dependence: Secondary | ICD-10-CM | POA: Insufficient documentation

## 2014-03-27 LAB — CBC WITH DIFFERENTIAL/PLATELET
Basophils Absolute: 0 10*3/uL (ref 0.0–0.1)
Basophils Relative: 0 % (ref 0–1)
EOS ABS: 0.1 10*3/uL (ref 0.0–0.7)
Eosinophils Relative: 2 % (ref 0–5)
HCT: 45.6 % (ref 39.0–52.0)
HEMOGLOBIN: 16.4 g/dL (ref 13.0–17.0)
LYMPHS ABS: 1.3 10*3/uL (ref 0.7–4.0)
Lymphocytes Relative: 34 % (ref 12–46)
MCH: 30.8 pg (ref 26.0–34.0)
MCHC: 36 g/dL (ref 30.0–36.0)
MCV: 85.6 fL (ref 78.0–100.0)
Monocytes Absolute: 0.4 10*3/uL (ref 0.1–1.0)
Monocytes Relative: 10 % (ref 3–12)
NEUTROS ABS: 2.1 10*3/uL (ref 1.7–7.7)
NEUTROS PCT: 54 % (ref 43–77)
PLATELETS: 118 10*3/uL — AB (ref 150–400)
RBC: 5.33 MIL/uL (ref 4.22–5.81)
RDW: 13.2 % (ref 11.5–15.5)
WBC: 3.9 10*3/uL — AB (ref 4.0–10.5)

## 2014-03-27 MED ORDER — OMEPRAZOLE 20 MG PO CPDR: 20.0000 mg | DELAYED_RELEASE_CAPSULE | Freq: Every day | ORAL | Status: DC

## 2014-03-27 MED ORDER — CYCLOBENZAPRINE HCL 10 MG PO TABS: 10.0000 mg | ORAL_TABLET | Freq: Every day | ORAL | Status: DC

## 2014-03-27 MED ORDER — IBUPROFEN 600 MG PO TABS: 600.0000 mg | ORAL_TABLET | Freq: Three times a day (TID) | ORAL | Status: DC | PRN

## 2014-03-27 NOTE — Progress Notes (Signed)
Patient presents today for annual exam. Patient is no longer taking prescribed medication for pain, does take some Ibuprofen when needed. Patient does have lower right back pain that right now is 5/10 pain. This pain has been present since 07/2013

## 2014-03-27 NOTE — Progress Notes (Signed)
Patient Demographics  Seth Harris, is a 38 y.o. male  [id number]:[id number]  [id number]  DOB - March 02, 1976  CC:  Chief Complaint  Patient presents with  . Annual Exam       HPI: Seth Harris is a 38 y.o. male here today for Annual physical examination. He has History low back pain, he had x-ray done in the past which was negative for fracture or or  disc disease, he is requesting refill on his medications sometimes he takes ibuprofen has is Flexeril at bedtime, patient also reported to have some epigastric burning sensation, he already had abdominal ultrasound done which was negative for gallstones, occasionally has some reflux symptoms denies any nausea vomiting change in bowel habits. Patient has No headache, No chest pain, No abdominal pain - No Nausea, No new weakness tingling or numbness, No Cough - SOB.  No Known Allergies Past Medical History  Diagnosis Date  . Allergy    No current outpatient prescriptions on file prior to visit.   No current facility-administered medications on file prior to visit.   Family History  Problem Relation Age of Onset  . Diabetes Mother   . Diabetes Brother    History   Social History  . Marital Status: Married    Spouse Name: N/A    Number of Children: N/A  . Years of Education: N/A   Occupational History  . Not on file.   Social History Main Topics  . Smoking status: Former Smoker    Types: Cigarettes    Quit date: 11/09/2001  . Smokeless tobacco: Not on file  . Alcohol Use: No  . Drug Use: No  . Sexual Activity: Yes    Birth Control/ Protection: None   Other Topics Concern  . Not on file   Social History Narrative  . No narrative on file    Review of Systems: Constitutional: Negative for fever, chills, diaphoresis, activity change, appetite change and fatigue. HENT: Negative for ear pain, nosebleeds, congestion, facial swelling, rhinorrhea, neck pain, neck stiffness and ear discharge.  Eyes: Negative for  pain, discharge, redness, itching and visual disturbance. Respiratory: Negative for cough, choking, chest tightness, shortness of breath, wheezing and stridor.  Cardiovascular: Negative for chest pain, palpitations and leg swelling. Gastrointestinal: Negative for abdominal distention. Genitourinary: Negative for dysuria, urgency, frequency, hematuria, flank pain, decreased urine volume, difficulty urinating and dyspareunia.  Musculoskeletal: Negative for back pain, joint swelling, arthralgia and gait problem. Neurological: Negative for dizziness, tremors, seizures, syncope, facial asymmetry, speech difficulty, weakness, light-headedness, numbness and headaches.  Hematological: Negative for adenopathy. Does not bruise/bleed easily. Psychiatric/Behavioral: Negative for hallucinations, behavioral problems, confusion, dysphoric mood, decreased concentration and agitation.    Objective:   Filed Vitals:   03/27/14 1047  BP: 124/74  Pulse: 66  Temp: 98.9 F (37.2 C)    Physical Exam: Constitutional: Patient appears well-developed and well-nourished. No distress. HENT: Normocephalic, atraumatic, External right and left ear normal. Oropharynx is clear and moist.  Eyes: Conjunctivae and EOM are normal. PERRLA, no scleral icterus. Neck: Normal ROM. Neck supple. No JVD. No tracheal deviation. No thyromegaly. CVS: RRR, S1/S2 +, no murmurs, no gallops, no carotid bruit.  Pulmonary: Effort and breath sounds normal, no stridor, rhonchi, wheezes, rales.  Abdominal: Soft. BS +, no distension, tenderness, rebound or guarding.  Musculoskeletal: Normal range of motion. No edema and no tenderness.  Neuro: Alert. Normal reflexes, muscle tone coordination. No cranial nerve deficit. Skin: Skin is warm and dry. No rash noted. Not  diaphoretic. No erythema. No pallor. Psychiatric: Normal mood and affect. Behavior, judgment, thought content normal.  Lab Results  Component Value Date   WBC 4.8 02/18/2013   HGB  16.0 02/18/2013   HCT 49.5 02/18/2013   MCV 95.4 02/18/2013   PLT 153 05/29/2009   Lab Results  Component Value Date   CREATININE 0.90 06/17/2012   BUN 12 06/17/2012   NA 138 06/17/2012   K 4.3 06/17/2012   CL 103 06/17/2012   CO2 28 06/17/2012    No results found for this basename: HGBA1C   Lipid Panel     Component Value Date/Time   CHOL 150 06/17/2012 1130   TRIG 223* 06/17/2012 1130   HDL 34* 06/17/2012 1130   CHOLHDL 4.4 06/17/2012 1130   VLDL 45* 06/17/2012 1130   LDLCALC 71 06/17/2012 1130       Assessment and plan:   1. Annual physical exam We'll do baseline blood work. - COMPLETE METABOLIC PANEL WITH GFR - Vit D  25 hydroxy (rtn osteoporosis monitoring) - TSH - CBC with Differential  2. Back muscle spasm Advise patient to apply heating pad, medication refill done. - cyclobenzaprine (FLEXERIL) 10 MG tablet; Take 1 tablet (10 mg total) by mouth at bedtime.  Dispense: 30 tablet; Refill: 3 - ibuprofen (ADVIL,MOTRIN) 600 MG tablet; Take 1 tablet (600 mg total) by mouth every 8 (eight) hours as needed.  Dispense: 30 tablet; Refill: 1  3. Dyspepsia Advised patient for last modification, trial of Prilosec. - omeprazole (PRILOSEC) 20 MG capsule; Take 1 capsule (20 mg total) by mouth daily.  Dispense: 30 capsule; Refill: 3   Return in about 6 months (around 09/25/2014), or if symptoms worsen or fail to improve, for back pain.  Doris Cheadle, MD

## 2014-03-27 NOTE — Progress Notes (Deleted)
Patient Demographics  Seth Harris, is a 38 y.o. male  ZOX:[id number]  [id number]  DOB - 1975-12-07  CC:  Chief Complaint  Patient presents with  . Annual Exam       HPI: Seth Harris is a 38 y.o. male here today to establish medical care. Patient has No headache, No chest pain, No abdominal pain - No Nausea, No new weakness tingling or numbness, No Cough - SOB.  No Known Allergies Past Medical History  Diagnosis Date  . Allergy    Current Outpatient Prescriptions on File Prior to Visit  Medication Sig Dispense Refill  . ibuprofen (ADVIL,MOTRIN) 600 MG tablet Take 1 tablet (600 mg total) by mouth every 8 (eight) hours as needed.  30 tablet  1   No current facility-administered medications on file prior to visit.   Family History  Problem Relation Age of Onset  . Diabetes Mother   . Diabetes Brother    History   Social History  . Marital Status: Married    Spouse Name: N/A    Number of Children: N/A  . Years of Education: N/A   Occupational History  . Not on file.   Social History Main Topics  . Smoking status: Former Smoker    Types: Cigarettes    Quit date: 11/09/2001  . Smokeless tobacco: Not on file  . Alcohol Use: No  . Drug Use: No  . Sexual Activity: Yes    Birth Control/ Protection: None   Other Topics Concern  . Not on file   Social History Narrative  . No narrative on file    Review of Systems: Constitutional: Negative for fever, chills, diaphoresis, activity change, appetite change and fatigue. HENT: Negative for ear pain, nosebleeds, congestion, facial swelling, rhinorrhea, neck pain, neck stiffness and ear discharge.  Eyes: Negative for pain, discharge, redness, itching and visual disturbance. Respiratory: Negative for cough, choking, chest tightness, shortness of breath, wheezing and stridor.  Cardiovascular: Negative for chest pain, palpitations and leg swelling. Gastrointestinal: Negative for abdominal  distention. Genitourinary: Negative for dysuria, urgency, frequency, hematuria, flank pain, decreased urine volume, difficulty urinating and dyspareunia.  Musculoskeletal: Negative for back pain, joint swelling, arthralgia and gait problem. Neurological: Negative for dizziness, tremors, seizures, syncope, facial asymmetry, speech difficulty, weakness, light-headedness, numbness and headaches.  Hematological: Negative for adenopathy. Does not bruise/bleed easily. Psychiatric/Behavioral: Negative for hallucinations, behavioral problems, confusion, dysphoric mood, decreased concentration and agitation.    Objective:   Filed Vitals:   03/27/14 1047  BP: 124/74  Pulse: 66  Temp: 98.9 F (37.2 C)    Physical Exam: Constitutional: Patient appears well-developed and well-nourished. No distress. HENT: Normocephalic, atraumatic, External right and left ear normal. Oropharynx is clear and moist.  Eyes: Conjunctivae and EOM are normal. PERRLA, no scleral icterus. Neck: Normal ROM. Neck supple. No JVD. No tracheal deviation. No thyromegaly. CVS: RRR, S1/S2 +, no murmurs, no gallops, no carotid bruit.  Pulmonary: Effort and breath sounds normal, no stridor, rhonchi, wheezes, rales.  Abdominal: Soft. BS +, no distension, tenderness, rebound or guarding.  Musculoskeletal: Normal range of motion. No edema and no tenderness.  Lymphadenopathy: No lymphadenopathy noted, cervical, inguinal or axillary Neuro: Alert. Normal reflexes, muscle tone coordination. No cranial nerve deficit. Skin: Skin is warm and dry. No rash noted. Not diaphoretic. No erythema. No pallor. Psychiatric: Normal mood and affect. Behavior, judgment, thought content normal.  Lab Results  Component Value Date   WBC 4.8 02/18/2013   HGB 16.0 02/18/2013  HCT 49.5 02/18/2013   MCV 95.4 02/18/2013   PLT 153 05/29/2009   Lab Results  Component Value Date   CREATININE 0.90 06/17/2012   BUN 12 06/17/2012   NA 138 06/17/2012   K 4.3  06/17/2012   CL 103 06/17/2012   CO2 28 06/17/2012    No results found for this basename: HGBA1C   Lipid Panel     Component Value Date/Time   CHOL 150 06/17/2012 1130   TRIG 223* 06/17/2012 1130   HDL 34* 06/17/2012 1130   CHOLHDL 4.4 06/17/2012 1130   VLDL 45* 06/17/2012 1130   LDLCALC 71 06/17/2012 1130       Assessment and plan:   1. Annual physical exam ***  2. Back muscle spasm ***  3. Dyspepsia ***      Return in about 6 months (around 09/25/2014), or if symptoms worsen or fail to improve, for back pain.     Doris Cheadle, MD

## 2014-03-28 LAB — COMPLETE METABOLIC PANEL WITH GFR
ALBUMIN: 4.8 g/dL (ref 3.5–5.2)
ALK PHOS: 50 U/L (ref 39–117)
ALT: 27 U/L (ref 0–53)
AST: 27 U/L (ref 0–37)
BILIRUBIN TOTAL: 0.6 mg/dL (ref 0.2–1.2)
BUN: 15 mg/dL (ref 6–23)
CO2: 29 mEq/L (ref 19–32)
CREATININE: 0.92 mg/dL (ref 0.50–1.35)
Calcium: 10 mg/dL (ref 8.4–10.5)
Chloride: 101 mEq/L (ref 96–112)
GFR, Est African American: >89 mL/min
GLUCOSE: 100 mg/dL — AB (ref 70–99)
Potassium: 4.9 mEq/L (ref 3.5–5.3)
Sodium: 140 mEq/L (ref 135–145)
Total Protein: 7.6 g/dL (ref 6.0–8.3)

## 2014-03-28 LAB — TSH: TSH: 1.572 u[IU]/mL (ref 0.350–4.500)

## 2014-03-28 LAB — VITAMIN D 25 HYDROXY (VIT D DEFICIENCY, FRACTURES): VIT D 25 HYDROXY: 22 ng/mL — AB (ref 30–89)

## 2014-04-05 ENCOUNTER — Other Ambulatory Visit: Payer: Self-pay | Admitting: *Deleted

## 2014-04-05 ENCOUNTER — Telehealth: Payer: Self-pay | Admitting: *Deleted

## 2014-04-05 DIAGNOSIS — E559 Vitamin D deficiency, unspecified: Secondary | ICD-10-CM

## 2014-04-05 MED ORDER — VITAMIN D (ERGOCALCIFEROL) 1.25 MG (50000 UNIT) PO CAPS: 50000.0000 [IU] | ORAL_CAPSULE | ORAL | Status: DC

## 2014-04-05 NOTE — Telephone Encounter (Signed)
Left message to return call 

## 2014-04-05 NOTE — Telephone Encounter (Signed)
Message copied by Dyann Kief on Fri Apr 05, 2014 11:10 AM ------      Message from: Doris Cheadle      Created: Thu Mar 28, 2014  9:44 AM       Blood work reviewed, noticed low vitamin D, call patient advise to start ergocalciferol 50,000 units once a week for the duration of  12 weeks.      Blood work reviewed noticed impaired fasting glucose, call and advise patient for low carbohydrate diet.       ------

## 2014-04-05 NOTE — Telephone Encounter (Signed)
Pt aware of lab results, Rx ergocalciferol e-scribe to our pharmacy

## 2014-04-05 NOTE — Telephone Encounter (Signed)
Message copied by Dyann Kief on Fri Apr 05, 2014 10:33 AM ------      Message from: Doris Cheadle      Created: Thu Mar 28, 2014  9:44 AM       Blood work reviewed, noticed low vitamin D, call patient advise to start ergocalciferol 50,000 units once a week for the duration of  12 weeks.      Blood work reviewed noticed impaired fasting glucose, call and advise patient for low carbohydrate diet.       ------

## 2014-04-25 ENCOUNTER — Telehealth: Payer: Self-pay | Admitting: Emergency Medicine

## 2014-04-25 NOTE — Telephone Encounter (Signed)
Left message with pt blood results and instructions to start taking Vitamin D 50,000 units daily x 12 weeks [facility] Spanish interpretor available

## 2014-04-26 ENCOUNTER — Ambulatory Visit: Payer: Self-pay

## 2014-05-03 ENCOUNTER — Ambulatory Visit: Payer: Self-pay | Attending: Internal Medicine

## 2014-05-06 DIAGNOSIS — Z8719 Personal history of other diseases of the digestive system: Secondary | ICD-10-CM | POA: Insufficient documentation

## 2014-05-06 DIAGNOSIS — Z9889 Other specified postprocedural states: Secondary | ICD-10-CM

## 2014-05-22 DIAGNOSIS — K4020 Bilateral inguinal hernia, without obstruction or gangrene, not specified as recurrent: Secondary | ICD-10-CM | POA: Insufficient documentation

## 2014-06-17 ENCOUNTER — Telehealth: Payer: Self-pay | Admitting: Internal Medicine

## 2014-06-17 NOTE — Telephone Encounter (Signed)
Pt. Came into facility to request a letter giving him clearance for work, pt. 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 [place] 04[place]2014 [place] [facility]. Pt would like to have letter by the end of the week in order for pt. To start working again.Please f/u with pt.

## 2014-06-17 NOTE — Telephone Encounter (Signed)
Left voice message need appointment with PCP for clearance.

## 2014-06-25 ENCOUNTER — Ambulatory Visit: Payer: Self-pay | Attending: Internal Medicine | Admitting: Internal Medicine

## 2014-06-25 ENCOUNTER — Encounter: Payer: Self-pay | Admitting: Internal Medicine

## 2014-06-25 VITALS — BP 120/77 | HR 70 | Temp 98.0°F | Resp 16 | Wt 187.8 lb

## 2014-06-25 DIAGNOSIS — Z8719 Personal history of other diseases of the digestive system: Secondary | ICD-10-CM

## 2014-06-25 DIAGNOSIS — Z9889 Other specified postprocedural states: Secondary | ICD-10-CM

## 2014-06-25 DIAGNOSIS — R1013 Epigastric pain: Secondary | ICD-10-CM | POA: Insufficient documentation

## 2014-06-25 NOTE — Progress Notes (Signed)
MRN: [id number] Name: Seth Harris  Sex: male Age: 38 y.o. DOB: 08/11/1975  Allergies: Review of patient's allergies indicates no known allergies.  Chief Complaint  Patient presents with  . Follow-up    HPI: Patient is 38 y.o. male who has to of GERD/dyspepsia her currently taking Prilosec, as per patient he had her hernia repair done in the month of October, patient is requesting the latter regarding returning to work and if any restrictions, patient was advised to have a followup with his surgeon for further evaluation and the recommendation regarding cleared  to go back to work, otherwise he denies any acute symptoms.  Past Medical History  Diagnosis Date  . Allergy     Past Surgical History  Procedure Laterality Date  . Hernia repair        Medication List       This list is accurate as of: 06/25/14 10:40 AM.  Always use your most recent med list.               cyclobenzaprine 10 MG tablet  Commonly known as:  FLEXERIL  Take 1 tablet (10 mg total) by mouth at bedtime.     ibuprofen 600 MG tablet  Commonly known as:  ADVIL,MOTRIN  Take 1 tablet (600 mg total) by mouth every 8 (eight) hours as needed.     omeprazole 20 MG capsule  Commonly known as:  PRILOSEC  Take 1 capsule (20 mg total) by mouth daily.     Vitamin D (Ergocalciferol) 50000 UNITS Caps capsule  Commonly known as:  DRISDOL  Take 1 capsule (50,000 Units total) by mouth every 7 (seven) days.        No orders of the defined types were placed in this encounter.     There is no immunization history on file for this patient.  Family History  Problem Relation Age of Onset  . Diabetes Mother   . Diabetes Brother     History  Substance Use Topics  . Smoking status: Former Smoker    Types: Cigarettes    Quit date: 11/09/2001  . Smokeless tobacco: Not on file  . Alcohol Use: No    Review of Systems   As noted in HPI  Filed Vitals:   06/25/14 1013  BP: 120/77  Pulse: 70    Temp: 98 F (36.7 C)  Resp: 16    Physical Exam  Physical Exam  Constitutional: No distress.  Eyes: EOM are normal. Pupils are equal, round, and reactive to light.  Cardiovascular: Normal rate and regular rhythm.   Pulmonary/Chest: Breath sounds normal. No respiratory distress. He has no wheezes. He has no rales.  Musculoskeletal: He exhibits no edema.    CBC    Component Value Date/Time   WBC 3.9* 03/27/2014 1135   WBC 4.8 02/18/2013 1054   RBC 5.33 03/27/2014 1135   RBC 5.19 02/18/2013 1054   HGB 16.4 03/27/2014 1135   HGB 16.0 02/18/2013 1054   HCT 45.6 03/27/2014 1135   HCT 49.5 02/18/2013 1054   PLT 118* 03/27/2014 1135   MCV 85.6 03/27/2014 1135   MCV 95.4 02/18/2013 1054   LYMPHSABS 1.3 03/27/2014 1135   MONOABS 0.4 03/27/2014 1135   EOSABS 0.1 03/27/2014 1135   BASOSABS 0.0 03/27/2014 1135    CMP     Component Value Date/Time   NA 140 03/27/2014 1135   K 4.9 03/27/2014 1135   CL 101 03/27/2014 1135   CO2 29 03/27/2014 1135  GLUCOSE 100* 03/27/2014 1135   BUN 15 03/27/2014 1135   CREATININE 0.92 03/27/2014 1135   CREATININE 0.91 05/29/2009 1336   CALCIUM 10.0 03/27/2014 1135   PROT 7.6 03/27/2014 1135   ALBUMIN 4.8 03/27/2014 1135   AST 27 03/27/2014 1135   ALT 27 03/27/2014 1135   ALKPHOS 50 03/27/2014 1135   BILITOT 0.6 03/27/2014 1135   GFRNONAA >89 03/27/2014 1135   GFRNONAA >60 05/29/2009 1336   GFRAA >89 03/27/2014 1135   GFRAA  05/29/2009 1336    >60        The eGFR has been calculated using the MDRD equation. This calculation has not been validated in all clinical situations. eGFR's persistently <60 mL/min signify possible Chronic Kidney Disease.    Lab Results  Component Value Date/Time   CHOL 150 06/17/2012 11:30 AM    No components found for: HGA1C  Lab Results  Component Value Date/Time   AST 27 03/27/2014 11:35 AM    Assessment and Plan  Dyspepsia Symptoms are stable continue with Prilosec.  History of hernia  surgery Patient to follow with his surgeon and needs a letter to return to work  Lorayne Marek, MD

## 2014-06-25 NOTE — Progress Notes (Signed)
Patient here with interpreter Patient here for medical clearance from surgery to return to work Patient was explained that the surgeon has to do his medical clearance  In order for him to return back to work with any restrictions

## 2014-10-10 ENCOUNTER — Ambulatory Visit: Payer: Self-pay | Attending: Internal Medicine

## 2014-10-31 ENCOUNTER — Encounter: Payer: Self-pay | Admitting: Physician Assistant

## 2014-10-31 ENCOUNTER — Ambulatory Visit: Payer: Self-pay | Attending: Internal Medicine | Admitting: Physician Assistant

## 2014-10-31 VITALS — BP 117/66 | HR 99 | Temp 102.8°F | Resp 18 | Ht 72.0 in | Wt 193.8 lb

## 2014-10-31 DIAGNOSIS — R5081 Fever presenting with conditions classified elsewhere: Secondary | ICD-10-CM

## 2014-10-31 MED ORDER — AZITHROMYCIN 250 MG PO TABS: ORAL_TABLET | ORAL | Status: DC

## 2014-10-31 MED ORDER — ACETAMINOPHEN 500 MG PO TABS
1000.0000 mg | ORAL_TABLET | Freq: Once | ORAL | Status: AC
  Administered 2014-10-31: 1000 mg via ORAL

## 2014-10-31 NOTE — Progress Notes (Signed)
Patient reports neck, shoulders, and back aching, chills and goose bumps. Patient feels like "I just want to close my eyes". Symptoms started Tuesday afternoon with mild symptoms, yesterday was mild, today was worse. Patient took Nyquil and slept last night.

## 2014-10-31 NOTE — Progress Notes (Signed)
Chief Complaint: Fever and cold sxs  Subjective: This is a 39 year old male presenting with 24-36 hours of facial and nasal pain. He's also had achy this in his neck and shoulders and back. He had to leave work on yesterday early secondary to his symptoms. He's had a fever as well. He has a little cough, little phlegm reduction and some fatigue. He denies shortness of breath. He denies runny nose. He denies headache. He denies dizziness. He does have a little bit of a sore throat. He denies nausea.   ROS:  GEN: fever, no chills, denies change in weight HEENT: + headache,+ earache,no  epistaxis, +sore throat, + neck pain LUNGS: denies SHOB, dyspnea, PND, orthopnea CV: denies CP or palpitations   Objective:  Filed Vitals:   10/31/14 1449  BP: 117/66  Pulse: 99  Temp: 102.8 F (39.3 C)  TempSrc: Oral  Resp: 18  Height: 6' (1.829 m)  Weight: 193 lb 12.8 oz (87.907 kg)  SpO2: 99%    Physical Exam:  General: in no acute distress. HEENT: Ears are injected, no drainage. Eyes are irritated. Throat is injected without exudate. No lymphadenopathy. No facial tenderness. Heart: Normal  s1 &s2  Regular rate and rhythm, without murmurs, rubs, gallops. Lungs: Clear to auscultation bilaterally.  Pertinent Lab Results:none   Medications: Prior to Admission medications   Medication Sig Start Date End Date Taking? Authorizing Provider  azithromycin (ZITHROMAX) 250 MG tablet URI 10/31/14   Vivianne Masteriffany S Danille Oppedisano, PA-C  cyclobenzaprine (FLEXERIL) 10 MG tablet Take 1 tablet (10 mg total) by mouth at bedtime. 03/27/14   Doris Cheadleeepak Advani, MD  ibuprofen (ADVIL,MOTRIN) 600 MG tablet Take 1 tablet (600 mg total) by mouth every 8 (eight) hours as needed. 03/27/14   Doris Cheadleeepak Advani, MD  omeprazole (PRILOSEC) 20 MG capsule Take 1 capsule (20 mg total) by mouth daily. 03/27/14   Doris Cheadleeepak Advani, MD  Vitamin D, Ergocalciferol, (DRISDOL) 50000 UNITS CAPS capsule Take 1 capsule (50,000 Units total) by mouth every 7 (seven)  days. 04/05/14   Doris Cheadleeepak Advani, MD    Assessment: 1. URI: infectious vs viral  Plan: Reassurance Tylenol or Ibuprofen OTC for fever as needed Z-pack Work note-may return on Monday 11/04/14  Follow up:as scheduled  The patient was given clear instructions to go to ER or return to medical center on Monday if symptoms don't improve, worsen or new problems develop. The patient verbalized understanding. The patient was told to call to get lab results if they haven't heard anything in the next week.   This note has been created with Education officer, environmentalDragon speech recognition software and smart phrase technology. Any transcriptional errors are unintentional.   Scot Juniffany Shanele Nissan, PA-C 10/31/2014, 3:13 PM

## 2014-10-31 NOTE — Patient Instructions (Signed)
Take Tylenol or ibuprofen every 6-8 hours with meals to reduce her fever Take antibiotic once daily for 5 days No work until Monday Call us on Monday if your symptoms have not improved

## 2015-04-16 ENCOUNTER — Ambulatory Visit: Payer: Self-pay | Attending: Family Medicine

## 2015-08-31 ENCOUNTER — Ambulatory Visit (INDEPENDENT_AMBULATORY_CARE_PROVIDER_SITE_OTHER): Payer: Self-pay | Admitting: Family Medicine

## 2015-08-31 VITALS — BP 128/80 | HR 81 | Temp 98.3°F | Resp 18 | Ht 72.0 in | Wt 209.6 lb

## 2015-08-31 DIAGNOSIS — Z131 Encounter for screening for diabetes mellitus: Secondary | ICD-10-CM

## 2015-08-31 DIAGNOSIS — M545 Low back pain, unspecified: Secondary | ICD-10-CM

## 2015-08-31 DIAGNOSIS — Z789 Other specified health status: Secondary | ICD-10-CM

## 2015-08-31 DIAGNOSIS — Z23 Encounter for immunization: Secondary | ICD-10-CM

## 2015-08-31 DIAGNOSIS — N410 Acute prostatitis: Secondary | ICD-10-CM

## 2015-08-31 DIAGNOSIS — Z13220 Encounter for screening for lipoid disorders: Secondary | ICD-10-CM

## 2015-08-31 DIAGNOSIS — Z0000 Encounter for general adult medical examination without abnormal findings: Secondary | ICD-10-CM

## 2015-08-31 LAB — COMPLETE METABOLIC PANEL WITH GFR
ALT: 30 U/L (ref 9–46)
AST: 24 U/L (ref 10–40)
Albumin: 4.6 g/dL (ref 3.6–5.1)
Alkaline Phosphatase: 54 U/L (ref 40–115)
BILIRUBIN TOTAL: 0.7 mg/dL (ref 0.2–1.2)
BUN: 14 mg/dL (ref 7–25)
CALCIUM: 9.4 mg/dL (ref 8.6–10.3)
CHLORIDE: 103 mmol/L (ref 98–110)
CO2: 28 mmol/L (ref 20–31)
CREATININE: 0.83 mg/dL (ref 0.60–1.35)
GFR, Est Non African American: >89 mL/min (ref >=60)
Glucose, Bld: 106 mg/dL — ABNORMAL HIGH (ref 65–99)
Potassium: 4.5 mmol/L (ref 3.5–5.3)
Sodium: 139 mmol/L (ref 135–146)
TOTAL PROTEIN: 7.8 g/dL (ref 6.1–8.1)

## 2015-08-31 LAB — POCT URINALYSIS DIP (MANUAL ENTRY)
Bilirubin, UA: NEGATIVE
Glucose, UA: NEGATIVE
Ketones, POC UA: NEGATIVE
Leukocytes, UA: NEGATIVE
NITRITE UA: NEGATIVE
PH UA: 6.5
PROTEIN UA: NEGATIVE
RBC UA: NEGATIVE
SPEC GRAV UA: 1.015
UROBILINOGEN UA: 0.2

## 2015-08-31 LAB — POCT CBC
GRANULOCYTE PERCENT: 61 % (ref 37–80)
HCT, POC: 49.5 % (ref 43.5–53.7)
HEMOGLOBIN: 17 g/dL (ref 14.1–18.1)
Lymph, poc: 1.7 (ref 0.6–3.4)
MCH: 30.3 pg (ref 27–31.2)
MCHC: 34.4 g/dL (ref 31.8–35.4)
MCV: 88.1 fL (ref 80–97)
MID (CBC): 0.3 (ref 0–0.9)
MPV: 7.7 fL (ref 0–99.8)
PLATELET COUNT, POC: 141 10*3/uL — AB (ref 142–424)
POC Granulocyte: 3 (ref 2–6.9)
POC LYMPH PERCENT: 33.4 %L (ref 10–50)
POC MID %: 5.6 %M (ref 0–12)
RBC: 5.62 M/uL (ref 4.69–6.13)
RDW, POC: 13.4 %
WBC: 5 10*3/uL (ref 4.6–10.2)

## 2015-08-31 LAB — LIPID PANEL
Cholesterol: 146 mg/dL (ref 125–200)
HDL: 37 mg/dL — ABNORMAL LOW (ref >=40)
LDL CALC: 82 mg/dL (ref <130)
Total CHOL/HDL Ratio: 3.9 Ratio (ref <=5.0)
Triglycerides: 133 mg/dL (ref <150)
VLDL: 27 mg/dL (ref <30)

## 2015-08-31 LAB — POC MICROSCOPIC URINALYSIS (UMFC): Mucus: ABSENT

## 2015-08-31 MED ORDER — CIPROFLOXACIN HCL 500 MG PO TABS: 500.0000 mg | ORAL_TABLET | Freq: Two times a day (BID) | ORAL | Status: DC

## 2015-08-31 NOTE — Patient Instructions (Addendum)
Antibiotic 2 veces cada dia. resulta de laboratorios en 7-10 dias.  regrese si no esta mejor en una semana, mas temproano si empeorse.   Tylenol o motrin si necesario por su espalda. si dolor remanece en 4-6 semanas - regrese hablar mas de esta problema. Mas temprano si empeorse.   Prostatitis (Prostatitis) La prstata tiene aproximadamente el tamao y la forma de Sharee Pimple. Se ubica justo debajo de la vejiga. Produce uno de los componentes del semen, formado por los espermatozoides y los lquidos que ayudan a nutrirlos y transportarlos fuera de los testculos. La prostatitis es una inflamacin de la prstata.  Hay cuatro tipos de prostatitis: 1. Prostatitis bacteriana aguda. Es el tipo menos frecuente de prostatitis. Comienza rpidamente y, por lo general, est acompaada por una infeccin de la vejiga, fiebre alta y escalofros. Puede ocurrir a Actuary. 2. Prostatitis bacteriana crnica. Es una infeccin bacteriana persistente en la prstata. Generalmente aparece luego de una prostatitis bacteriana aguda que se repite o que no fue tratada adecuadamente. Se puede presentar en hombres de cualquier edad pero es ms comn en los hombres de mediana edad cuya prstata ha comenzado a Government social research officer. Los sntomas no son tan graves como los de la prostatitis [place]. La principal [employer] ser en la parte del cuerpo que est delante del recto y debajo del escroto (perineo), en la parte baja del abdomen o en la punta del pene (glande).  3. Prostatitis crnica (no bacteriana). Es el tipo ms frecuente de prostatitis. Es la inflamacin de la prstata y no se origina por una infeccin bacteriana. Se desconoce su causa y puede estar asociada con infecciones virales o trastornos autoinmunitarios. 4. Prostatodinia (trastorno del suelo plvico). Est asociada con un aumento del tono muscular en la pelvis que rodea a la prstata. CAUSAS La causa de la prostatitis bacteriana es una infeccin bacteriana.  Se desconocen las causas de los otros tipos de prostatitis.  SNTOMAS  Los sntomas pueden variar segn el tipo de prostatitis. Tambin puede ocurrir que coincidan sntomas de diferentes tipos de prostatitis. Los sntomas posibles de cada tipo de prostatitis se enumeran a continuacin. Prostatitis bacteriana aguda 1. Dolor al Beatrix Shipper. 2. Grant Ruts o escalofros. 3. Dolor muscular o en las articulaciones. 4. Dolor lumbar. 5. Dolor en la zona inferior del abdomen. 6. Imposibilidad de vaciar la vejiga completamente. Prostatitis bacteriana crnica, prostatitis crnica no bacteriana y prostatodinia 1. Necesidad urgente y repentina de Geographical information systems officer. 2. Ganas de orinar con frecuencia. 3. Dificultad para comenzar a eliminar la orina. 4. Chorro de orina dbil. 5. Secrecin por la uretra. 6. Goteo al terminar Brita Romp. 7. Dolor rectal. 8. Dolor en los testculos, el pene o la punta del pene. 9. Dolor en el perineo. 10. Problemas con la funcin sexual. 11. Eyaculacin dolorosa. 12. Semen con sangre. DIAGNSTICO  Su mdico le preguntar acerca de sus sntomas para hacer el diagnstico de prostatitis. Se recolectarn y analizarn una o ms muestras de [place] (anlisis de [place]). Si el resultado del anlisis de [place] es negativo para bacterias, su mdico podr palpar su prstata con un dedo (examen dgito rectal). Este examen ayuda a su mdico a determinar si la prstata est inflamada y sensible. Tambin se obtendr Lauris Poag de semen para analizarla. TRATAMIENTO  El tratamiento de la prostatitis depende de la causa. Si la causa es una infeccin [place], se puede tratar con antibiticos. En los casos de prostatitis bacteriana crnica, puede ser necesario el uso de antibiticos durante hasta o 6semanas. Su mdico  puede indicarle que tome baos de asiento para ayudar a Engineer, materials. Un bao de asiento es un bao de agua caliente en la que las caderas y las nalgas estn bajo el agua. Esto relaja los  msculos del suelo plvico y, a menudo, contribuye a Technical sales engineer presin en la prstata. INSTRUCCIONES PARA EL CUIDADO EN EL HOGAR   Tome todos los medicamentos como le indic el mdico.  Tome baos de asiento segn las indicaciones del mdico. SOLICITE ATENCIN MDICA SI:   Los sntomas empeoran en lugar de mejorar.  Tiene fiebre. SOLICITE ATENCIN MDICA DE INMEDIATO SI:   Tiene escalofros.  Siente nuseas o vomita.  Se siente mareado o se desmaya.  No puede orinar.  Tiene sangre o cogulos de sangre en la orina. ASEGRESE DE QUE:  Comprende estas instrucciones.  Controlar su afeccin.  Recibir ayuda de inmediato si no mejora o si empeora.   Esta informacin no tiene Theme park manager el consejo del mdico. Asegrese de hacerle al mdico cualquier pregunta que tenga.   Document Released: 04/14/2005 Document Revised: 07/26/2014 Elsevier Interactive Patient Education 2016 [employer].  Dolor de espalda en adultos (Back Pain, Adult) El dolor de espalda es muy frecuente en los adultos.La causa del dolor de espalda es rara vez peligrosa y Chief Technology Officer a menudo mejora con el [place].Es posible que se desconozca la causa de esta afeccin. Algunas causas comunes son las siguientes: 5. Distensin de los msculos o ligamentos que sostienen la columna vertebral. 6. Desgaste (degeneracin) de los discos vertebrales. 7. Artritis. 8. Lesiones directas en la espalda. En [employer], el dolor de espalda es recurrente. Como rara vez es peligroso, las personas pueden aprender a Psychologist, clinical afeccin por s mismas. INSTRUCCIONES PARA EL CUIDADO EN EL HOGAR Controle su dolor de espalda a fin de Public house manager cambio. Las siguientes indicaciones ayudarn a Paramedic cualquier molestia que pueda sentir: 7. Permanezca activo. Si permanece sentado o de pie en un mismo lugar durante mucho tiempo, se tensiona la espalda. No se siente, conduzca o permanezca de pie en un mismo lugar durante  ms de 30 minutos seguidos. Realice caminatas cortas en superficies planas tan pronto como le sea posible.Trate de caminar un poco ms de Pharmacist, community. 8. Haga ejercicio regularmente como se lo haya indicado el mdico. El ejercicio ayuda a que su espalda se cure ms rpidamente. Tambin ayuda a prevenir futuras lesiones al [employer] fuertes y flexibles. 9. No permanezca en la cama.Si hace reposo ms de 1 a 2 das, puede demorar su recuperacin. 10. Preste atencin a su cuerpo al inclinarse y levantarse. Las posiciones ms cmodas son las que ejercen menos tensin en la espalda en recuperacin. Siempre use tcnicas apropiadas para levantar objetos, como por ejemplo: 1. Flexionar las rodillas. 2. Mantener la carga cerca del cuerpo. 3. No torcerse. 11. Encuentre una posicin cmoda para dormir. Use un colchn firme y recustese de costado con las rodillas ligeramente flexionadas. Si se recuesta [employer], coloque una almohada debajo de las rodillas. 12. Evite sentir ansiedad o estrs.El estrs aumenta la tensin muscular y puede empeorar el dolor de espalda.Es importante reconocer si se siente ansioso o estresado y aprender maneras de controlarlo, por ejemplo haciendo ejercicio. 13. Tome los medicamentos solamente como se lo haya indicado el mdico. Los medicamentos de venta libre para Engineer, materials y la inflamacin a menudo son los ms eficaces.El mdico puede recetarle relajantes musculares.Estos medicamentos ayudan a Primary school teacher de modo que  pueda reanudar ms rpidamente sus actividades normales y el ejercicio saludable. 14. Aplique hielo sobre la zona lesionada. 1. Ponga el hielo en una bolsa plstica. 2. Coloque una toalla entre la piel y la bolsa de hielo. 3. Deje el hielo durante , 2 a 3veces por da, durante los primeros 2 o 3das. Despus de eso, puede alternar el hielo y el calor para reducir Chief Technology Officer y los espasmos. 15. Mantenga un peso saludable.  El exceso de peso ejerce presin adicional sobre la espalda y hace que resulte difcil mantener una buena [place]. SOLICITE ATENCIN MDICA SI: 13. Siente un dolor que no se alivia con reposo o medicamentos. 14. Siente mucho dolor que se extiende a las piernas o los glteos. 15. El dolor no mejora en una semana. 16. Siente dolor por la noche. 17. Pierde peso. 18. Siente escalofros o fiebre. SOLICITE ATENCIN MDICA DE INMEDIATO SI:   Tiene nuevos problemas para controlar la vejiga o los intestinos.  Siente debilidad o adormecimiento inusuales en los brazos o en las piernas.  Siente nuseas o vmitos.  Siente dolor abdominal.  Siente que va a desmayarse.   Esta informacin no tiene Theme park manager el consejo del mdico. Asegrese de hacerle al mdico cualquier pregunta que tenga.   Document Released: 07/05/2005 Document Revised: 07/26/2014 Elsevier Interactive Patient Education [employer]   Keeping you healthy  Get these tests  Blood pressure- Have your blood pressure checked once a year by your healthcare provider.  Normal blood pressure is 120/80.  Weight- Have your body mass index (BMI) calculated to screen for obesity.  BMI is a measure of body fat based on height and weight. You can also calculate your own BMI at https://www.west-esparza.com/.  Cholesterol- Have your cholesterol checked regularly starting at age 45, sooner may be necessary if you have diabetes, high blood pressure, if a family member developed heart diseases at an early age or if you smoke.   Chlamydia, HIV, and other sexual transmitted disease- Get screened each year until the age of 51 then within three months of each new sexual partner.  Diabetes- Have your blood sugar checked regularly if you have high blood pressure, high cholesterol, a family history of diabetes or if you are overweight.  Get these vaccines  Flu shot- Every fall.  Tetanus shot- Every 10 years.  Menactra- Single dose;  prevents meningitis.  Take these steps  Don't smoke- If you do smoke, ask your healthcare provider about quitting. For tips on how to quit, go to www.smokefree.gov or call 1-800-QUIT-NOW.  Be physically active- Exercise 5 days a week for at least 30 minutes.  If you are not already physically active start slow and gradually work up to 30 minutes of moderate physical activity.  Examples of moderate activity include walking briskly, mowing the yard, dancing, swimming bicycling, etc.  Eat a healthy diet- Eat a variety of healthy foods such as fruits, vegetables, low fat milk, low fat cheese, yogurt, lean meats, poultry, fish, beans, tofu, etc.  For more information on healthy eating, go to www.thenutritionsource.org  Drink alcohol in moderation- Limit alcohol intake two drinks or less a day.  Never drink and drive.  Dentist- Brush and floss teeth twice daily; visit your dentis twice a year.  Depression-Your emotional health is as important as your physical health.  If you're feeling down, losing interest in things you normally enjoy please talk with your healthcare provider.  Gun Safety- If you keep a gun in your home, keep  it unloaded and with the safety lock on.  Bullets should be stored separately.  Helmet use- Always wear a helmet when riding a motorcycle, bicycle, rollerblading or skateboarding.  Safe sex- If you may be exposed to a sexually transmitted infection, use a condom  Seat belts- Seat bels can save your life; always wear one.  Smoke/Carbon Monoxide detectors- These detectors need to be installed on the appropriate level of your home.  Replace batteries at least once a year.  Skin Cancer- When out in the sun, cover up and use sunscreen SPF 15 or higher.  Violence- If anyone is threatening or hurting you, please tell your healthcare provider.

## 2015-08-31 NOTE — Progress Notes (Addendum)
Subjective:  By signing my name below, I, [name], attest that this documentation has been prepared under the direction and in the presence of [name], MD.  Electronically Signed: [name], ED Scribe. 08/31/2015. 9:20 AM.   Patient ID: Seth Harris, male    DOB: May 29, 1976, 40 y.o.   MRN: [id number]  HPI Chief Complaint  Patient presents with  . Annual Exam    prostate check    HPI Comments: Seth Harris is a 40 y.o. male who presents to the [facility] for a physical and testicular pain.   He has hx of hernia and GERD. Last seen here for acute issues in 2015.  Family hx of DM in brother in mother.  Pt c/o discomfort and slight itching in area between testicles and anus for 1 week. He reports left sided back pain and trouble urinating, feeling a sensation of discharge when urinating but there is not. He mentions some dribbling after urniating about 1 month ago. He had been with same partner for 10 years. He denies new sexual partners. He has noticed a few bumps on his penis over the past year. He has tried natural supplements, multivitamins with omega 3 but no OTC medication. He denies family hx of prostate cancer  Cancer screening Prostate cancer screening- will do this today based on current symptoms  Immunizations   TDAP- he is unsure of last tetanus.  Flu shot- he agrees to flu shot today.   Depression  Depression screen [facility] 2/9 08/31/2015 03/27/2014 09/20/2013  Decreased Interest 0 0 0  Down, Depressed, Hopeless 0 0 0  PHQ - 2 Score 0 0 0   He denies being depressed.    Vision   Visual Acuity Screening   Right eye Left eye Both eyes  Without correction: 20/25 20/25 20/20   With correction:      He has not been seen by optho.   Exercise  He does no exercise regularly at this time.  He reports some low back pain with walking. He will follow up with this at a later visit.   Patient Active Problem List   Diagnosis Date Noted  .  Dyspepsia 03/27/2014  . Inguinal pain 12/21/2013  . Back strain 12/21/2013  . RUQ pain 12/21/2013   Past Medical History  Diagnosis Date  . Allergy    Past Surgical History  Procedure Laterality Date  . Hernia repair     No Known Allergies Prior to Admission medications   Not on File   Social History   Social History  . Marital Status: Married    Spouse Name: N/A  . Number of Children: N/A  . Years of Education: N/A   Occupational History  . Not on file.   Social History Main Topics  . Smoking status: Former Smoker    Types: Cigarettes    Quit date: 11/09/2001  . Smokeless tobacco: Not on file  . Alcohol Use: No  . Drug Use: No  . Sexual Activity: Yes    Birth Control/ Protection: None   Other Topics Concern  . Not on file   Social History Narrative   Review of Systems 13 point ROS reviewed. Positive for pain in testicles.   Objective:   Physical Exam  Constitutional: He is oriented to person, place, and time. He appears well-developed and well-nourished. No distress.  HENT:  Head: Normocephalic and atraumatic.  Eyes: Conjunctivae and EOM are normal.  Neck: Neck supple.  Cardiovascular: Normal rate.  Pulmonary/Chest: Effort normal.  Abdominal: Soft. He exhibits no distension. There is no tenderness.  Genitourinary: Right testis shows no tenderness. Left testis shows no tenderness. Uncircumcised. No discharge found.  Testicles: non tender. No penile discharge. few small pearly papules at the crown but no other rash. Slight discomfort in the perineum just distal to the scrotum  Prostate tender, possibly borderline enlarged but no nodules.   Musculoskeletal: Normal range of motion.  No CVA tenderness. No focal tenderness of the lumbar spine.  Neurological: He is alert and oriented to person, place, and time.  Skin: Skin is warm and dry.  Psychiatric: He has a normal mood and affect. His behavior is normal.  Nursing note and vitals reviewed.  Filed  Vitals:   08/31/15 0854  BP: 128/80  Pulse: 81  Temp: 98.3 F (36.8 C)  TempSrc: Oral  Resp: 18  Height: 6' (1.829 m)  Weight: 209 lb 9.6 oz (95.074 kg)  SpO2: 99%   Results for orders placed or performed in visit on 08/31/15  POCT urinalysis dipstick  Result Value Ref Range   Color, UA yellow yellow   Clarity, UA clear clear   Glucose, UA negative negative   Bilirubin, UA negative negative   Ketones, POC UA negative negative   Spec Grav, UA 1.015    Blood, UA negative negative   pH, UA 6.5    Protein Ur, POC negative negative   Urobilinogen, UA 0.2    Nitrite, UA Negative Negative   Leukocytes, UA Negative Negative  POCT Microscopic Urinalysis ([facility])  Result Value Ref Range   WBC,UR,HPF,POC Few (A) None WBC/hpf   RBC,UR,HPF,POC None None RBC/hpf   Bacteria None None, Too numerous to count   Mucus Absent Absent   Epithelial Cells, UR Per Microscopy Few (A) None, Too numerous to count cells/hpf  POCT CBC  Result Value Ref Range   WBC 5.0 4.6 - 10.2 K/uL   Lymph, poc 1.7 0.6 - 3.4   POC LYMPH PERCENT 33.4 10 - 50 %L   MID (cbc) 0.3 0 - 0.9   POC MID % 5.6 0 - 12 %M   POC Granulocyte 3.0 2 - 6.9   Granulocyte percent 61.0 37 - 80 %G   RBC 5.62 4.69 - 6.13 M/uL   Hemoglobin 17.0 14.1 - 18.1 g/dL   HCT, POC 16.1 09.6 - 53.7 %   MCV 88.1 80 - 97 fL   MCH, POC 30.3 27 - 31.2 pg   MCHC 34.4 31.8 - 35.4 g/dL   RDW, POC 04.5 %   Platelet Count, POC 141 (A) 142 - 424 K/uL   MPV 7.7 0 - 99.8 fL    Assessment & Plan:   Ronel Rodeheaver is a 39 y.o. male Annual physical exam  -anticipatory guidance as below in AVS, screening labs above. Health maintenance items as above in HPI discussed/recommended as applicable.   Prostatitis, acute - Plan: PSA, Urine culture, POCT urinalysis dipstick, POCT Microscopic Urinalysis ([facility]), POCT CBC, ciprofloxacin (CIPRO) 500 MG tablet   start Cipro, check urine culture, PSA, RTC precautions.  Midline low back pain without sciatica  -  Mechanical, recurrent pain with type work likely. No red flags on exam. Discussed lumbar roll with traveling, symptomatic care, and return to clinic precautions discuss further if changes.  Language barrier  - Spanish spoken, understanding expressed. Screening for hyperlipidemia - Plan: Lipid panel  Screening for diabetes mellitus - Plan: COMPLETE METABOLIC PANEL WITH GFR  Need for Tdap vaccination -  Plan: Tdap vaccine greater than or equal to 7yo IM given  Flu vaccine need - Plan: Flu Vaccine QUAD 36+ mos IM given   Meds ordered this encounter  Medications  . ciprofloxacin (CIPRO) 500 MG tablet    Sig: Take 1 tablet (500 mg total) by mouth 2 (two) times daily.    Dispense:  20 tablet    Refill:  0   Patient Instructions  Antibiotic 2 veces cada dia. resulta de laboratorios en 7-10 dias.  regrese si no esta mejor en una semana, mas temproano si empeorse.   Tylenol o motrin si necesario por su espalda. si dolor remanece en 4-6 semanas - regrese hablar mas de esta problema. Mas temprano si empeorse.   Prostatitis (Prostatitis) La prstata tiene aproximadamente el tamao y la forma de Sharee Pimple. Se ubica justo debajo de la vejiga. Produce uno de los componentes del semen, formado por los espermatozoides y los lquidos que ayudan a nutrirlos y transportarlos fuera de los testculos. La prostatitis es una inflamacin de la prstata.  Hay cuatro tipos de prostatitis: 1. Prostatitis bacteriana aguda. Es el tipo menos frecuente de prostatitis. Comienza rpidamente y, por lo general, est acompaada por una infeccin de la vejiga, fiebre alta y escalofros. Puede ocurrir a Actuary. 2. Prostatitis bacteriana crnica. Es una infeccin bacteriana persistente en la prstata. Generalmente aparece luego de una prostatitis bacteriana aguda que se repite o que no fue tratada adecuadamente. Se puede presentar en hombres de cualquier edad pero es ms comn en los hombres de mediana edad cuya  prstata ha comenzado a Government social research officer. Los sntomas no son tan graves como los de la prostatitis [place]. La principal [employer] ser en la parte del cuerpo que est delante del recto y debajo del escroto (perineo), en la parte baja del abdomen o en la punta del pene (glande).  3. Prostatitis crnica (no bacteriana). Es el tipo ms frecuente de prostatitis. Es la inflamacin de la prstata y no se origina por una infeccin bacteriana. Se desconoce su causa y puede estar asociada con infecciones virales o trastornos autoinmunitarios. 4. Prostatodinia (trastorno del suelo plvico). Est asociada con un aumento del tono muscular en la pelvis que rodea a la prstata. CAUSAS La causa de la prostatitis bacteriana es una infeccin bacteriana. Se desconocen las causas de los otros tipos de prostatitis.  SNTOMAS  Los sntomas pueden variar segn el tipo de prostatitis. Tambin puede ocurrir que coincidan sntomas de diferentes tipos de prostatitis. Los sntomas posibles de cada tipo de prostatitis se enumeran a continuacin. Prostatitis bacteriana aguda 1. Dolor al Beatrix Shipper. 2. Grant Ruts o escalofros. 3. Dolor muscular o en las articulaciones. 4. Dolor lumbar. 5. Dolor en la zona inferior del abdomen. 6. Imposibilidad de vaciar la vejiga completamente. Prostatitis bacteriana crnica, prostatitis crnica no bacteriana y prostatodinia 1. Necesidad urgente y repentina de Geographical information systems officer. 2. Ganas de orinar con frecuencia. 3. Dificultad para comenzar a eliminar la orina. 4. Chorro de orina dbil. 5. Secrecin por la uretra. 6. Goteo al terminar Brita Romp. 7. Dolor rectal. 8. Dolor en los testculos, el pene o la punta del pene. 9. Dolor en el perineo. 10. Problemas con la funcin sexual. 11. Eyaculacin dolorosa. 12. Semen con sangre. DIAGNSTICO  Su mdico le preguntar acerca de sus sntomas para hacer el diagnstico de prostatitis. Se recolectarn y analizarn una o ms muestras de [place] (anlisis de  [place]). Si el resultado del anlisis de [place] es negativo para bacterias, su mdico podr palpar su prstata  con un dedo (examen dgito rectal). Este examen ayuda a su mdico a determinar si la prstata est inflamada y sensible. Tambin se obtendr Lauris Poag de semen para analizarla. TRATAMIENTO  El tratamiento de la prostatitis depende de la causa. Si la causa es una infeccin [place], se puede tratar con antibiticos. En los casos de prostatitis bacteriana crnica, puede ser necesario el uso de antibiticos durante hasta o 6semanas. Su mdico puede indicarle que tome baos de asiento para ayudar a Engineer, materials. Un bao de asiento es un bao de agua caliente en la que las caderas y las nalgas estn bajo el agua. Esto relaja los msculos del suelo plvico y, a menudo, contribuye a Technical sales engineer presin en la prstata. INSTRUCCIONES PARA EL CUIDADO EN EL HOGAR   Tome todos los medicamentos como le indic el mdico.  Tome baos de asiento segn las indicaciones del mdico. SOLICITE ATENCIN MDICA SI:   Los sntomas empeoran en lugar de mejorar.  Tiene fiebre. SOLICITE ATENCIN MDICA DE INMEDIATO SI:   Tiene escalofros.  Siente nuseas o vomita.  Se siente mareado o se desmaya.  No puede orinar.  Tiene sangre o cogulos de sangre en la orina. ASEGRESE DE QUE:  Comprende estas instrucciones.  Controlar su afeccin.  Recibir ayuda de inmediato si no mejora o si empeora.   Esta informacin no tiene Theme park manager el consejo del mdico. Asegrese de hacerle al mdico cualquier pregunta que tenga.   Document Released: 04/14/2005 Document Revised: 07/26/2014 Elsevier Interactive Patient Education 2016 [employer].  Dolor de espalda en adultos (Back Pain, Adult) El dolor de espalda es muy frecuente en los adultos.La causa del dolor de espalda es rara vez peligrosa y Chief Technology Officer a menudo mejora con el [place].Es posible que se desconozca la causa de esta afeccin.  Algunas causas comunes son las siguientes: 5. Distensin de los msculos o ligamentos que sostienen la columna vertebral. 6. Desgaste (degeneracin) de los discos vertebrales. 7. Artritis. 8. Lesiones directas en la espalda. En [employer], el dolor de espalda es recurrente. Como rara vez es peligroso, las personas pueden aprender a Psychologist, clinical afeccin por s mismas. INSTRUCCIONES PARA EL CUIDADO EN EL HOGAR Controle su dolor de espalda a fin de Public house manager cambio. Las siguientes indicaciones ayudarn a Paramedic cualquier molestia que pueda sentir: 7. Permanezca activo. Si permanece sentado o de pie en un mismo lugar durante mucho tiempo, se tensiona la espalda. No se siente, conduzca o permanezca de pie en un mismo lugar durante ms de 30 minutos seguidos. Realice caminatas cortas en superficies planas tan pronto como le sea posible.Trate de caminar un poco ms de Pharmacist, community. 8. Haga ejercicio regularmente como se lo haya indicado el mdico. El ejercicio ayuda a que su espalda se cure ms rpidamente. Tambin ayuda a prevenir futuras lesiones al [employer] fuertes y flexibles. 9. No permanezca en la cama.Si hace reposo ms de 1 a 2 das, puede demorar su recuperacin. 10. Preste atencin a su cuerpo al inclinarse y levantarse. Las posiciones ms cmodas son las que ejercen menos tensin en la espalda en recuperacin. Siempre use tcnicas apropiadas para levantar objetos, como por ejemplo: 1. Flexionar las rodillas. 2. Mantener la carga cerca del cuerpo. 3. No torcerse. 11. Encuentre una posicin cmoda para dormir. Use un colchn firme y recustese de costado con las rodillas ligeramente flexionadas. Si se recuesta [employer], coloque una almohada debajo de las rodillas. 12. Evite sentir ansiedad o estrs.El  estrs aumenta la tensin muscular y Engineer, production de espalda.Es importante reconocer si se siente ansioso o estresado y aprender maneras de  controlarlo, por ejemplo haciendo ejercicio. 13. Tome los medicamentos solamente como se lo haya indicado el mdico. Los medicamentos de venta libre para Engineer, materials y la inflamacin a menudo son los ms eficaces.El mdico puede recetarle relajantes musculares.Estos medicamentos ayudan a Primary school teacher de modo que pueda reanudar ms rpidamente sus actividades normales y el ejercicio saludable. 14. Aplique hielo sobre la zona lesionada. 1. Ponga el hielo en una bolsa plstica. 2. Coloque una toalla entre la piel y la bolsa de hielo. 3. Deje el hielo durante , 2 a 3veces por da, durante los primeros 2 o 3das. Despus de eso, puede alternar el hielo y el calor para reducir Chief Technology Officer y los espasmos. 15. Mantenga un peso saludable. El exceso de peso ejerce presin adicional sobre la espalda y hace que resulte difcil mantener una buena [place]. SOLICITE ATENCIN MDICA SI: 13. Siente un dolor que no se alivia con reposo o medicamentos. 14. Siente mucho dolor que se extiende a las piernas o los glteos. 15. El dolor no mejora en una semana. 16. Siente dolor por la noche. 17. Pierde peso. 18. Siente escalofros o fiebre. SOLICITE ATENCIN MDICA DE INMEDIATO SI:   Tiene nuevos problemas para controlar la vejiga o los intestinos.  Siente debilidad o adormecimiento inusuales en los brazos o en las piernas.  Siente nuseas o vmitos.  Siente dolor abdominal.  Siente que va a desmayarse.   Esta informacin no tiene Theme park manager el consejo del mdico. Asegrese de hacerle al mdico cualquier pregunta que tenga.   Document Released: 07/05/2005 Document Revised: 07/26/2014 Elsevier Interactive Patient Education [employer]   Keeping you healthy  Get these tests  Blood pressure- Have your blood pressure checked once a year by your healthcare provider.  Normal blood pressure is 120/80.  Weight- Have your body mass index (BMI) calculated to screen for  obesity.  BMI is a measure of body fat based on height and weight. You can also calculate your own BMI at https://www.west-esparza.com/.  Cholesterol- Have your cholesterol checked regularly starting at age 49, sooner may be necessary if you have diabetes, high blood pressure, if a family member developed heart diseases at an early age or if you smoke.   Chlamydia, HIV, and other sexual transmitted disease- Get screened each year until the age of 82 then within three months of each new sexual partner.  Diabetes- Have your blood sugar checked regularly if you have high blood pressure, high cholesterol, a family history of diabetes or if you are overweight.  Get these vaccines  Flu shot- Every fall.  Tetanus shot- Every 10 years.  Menactra- Single dose; prevents meningitis.  Take these steps  Don't smoke- If you do smoke, ask your healthcare provider about quitting. For tips on how to quit, go to www.smokefree.gov or call 1-800-QUIT-NOW.  Be physically active- Exercise 5 days a week for at least 30 minutes.  If you are not already physically active start slow and gradually work up to 30 minutes of moderate physical activity.  Examples of moderate activity include walking briskly, mowing the yard, dancing, swimming bicycling, etc.  Eat a healthy diet- Eat a variety of healthy foods such as fruits, vegetables, low fat milk, low fat cheese, yogurt, lean meats, poultry, fish, beans, tofu, etc.  For more information on healthy eating, go to www.thenutritionsource.org  Drink alcohol in moderation- Limit alcohol intake two drinks or less a day.  Never drink and drive.  Dentist- Brush and floss teeth twice daily; visit your dentis twice a year.  Depression-Your emotional health is as important as your physical health.  If you're feeling down, losing interest in things you normally enjoy please talk with your healthcare provider.  Gun Safety- If you keep a gun in your home, keep it unloaded and with  the safety lock on.  Bullets should be stored separately.  Helmet use- Always wear a helmet when riding a motorcycle, bicycle, rollerblading or skateboarding.  Safe sex- If you may be exposed to a sexually transmitted infection, use a condom  Seat belts- Seat bels can save your life; always wear one.  Smoke/Carbon Monoxide detectors- These detectors need to be installed on the appropriate level of your home.  Replace batteries at least once a year.  Skin Cancer- When out in the sun, cover up and use sunscreen SPF 15 or higher.  Violence- If anyone is threatening or hurting you, please tell your healthcare provider.    I personally performed the services described in this documentation, which was scribed in my presence. The recorded information has been reviewed and considered, and addended by me as needed.

## 2015-09-01 LAB — URINE CULTURE: Colony Count: 3000

## 2015-09-01 LAB — PSA: PSA: 0.59 ng/mL (ref <=4.00)

## 2015-09-13 ENCOUNTER — Telehealth: Payer: Self-pay

## 2015-09-13 NOTE — Telephone Encounter (Signed)
PATIENT STATES SOMEONE FROM THIS PHONE NUMBER TRIED TO CALL HIM TODAY. HE DOES NOT KNOW WHY. BEST PHONE [phone] (CELL) MBC

## 2015-09-17 NOTE — Telephone Encounter (Signed)
Looks like Seth Harris spoke with Pt. About his lab results, did not see any other phone message

## 2015-09-23 ENCOUNTER — Ambulatory Visit: Payer: Self-pay | Attending: Family Medicine | Admitting: Family Medicine

## 2015-09-23 ENCOUNTER — Encounter: Payer: Self-pay | Admitting: Family Medicine

## 2015-09-23 VITALS — BP 126/81 | HR 75 | Temp 98.5°F | Resp 15 | Ht 72.0 in | Wt 211.0 lb

## 2015-09-23 DIAGNOSIS — Z131 Encounter for screening for diabetes mellitus: Secondary | ICD-10-CM

## 2015-09-23 DIAGNOSIS — N410 Acute prostatitis: Secondary | ICD-10-CM

## 2015-09-23 DIAGNOSIS — N419 Inflammatory disease of prostate, unspecified: Secondary | ICD-10-CM | POA: Insufficient documentation

## 2015-09-23 DIAGNOSIS — Z79899 Other long term (current) drug therapy: Secondary | ICD-10-CM | POA: Insufficient documentation

## 2015-09-23 DIAGNOSIS — R300 Dysuria: Secondary | ICD-10-CM

## 2015-09-23 DIAGNOSIS — M545 Low back pain, unspecified: Secondary | ICD-10-CM

## 2015-09-23 LAB — POCT URINALYSIS DIPSTICK
BILIRUBIN UA: NEGATIVE
Blood, UA: NEGATIVE
GLUCOSE UA: NEGATIVE
Ketones, UA: NEGATIVE
Leukocytes, UA: NEGATIVE
NITRITE UA: NEGATIVE
Protein, UA: NEGATIVE
Spec Grav, UA: 1.025
UROBILINOGEN UA: 0.2
pH, UA: 6.5

## 2015-09-23 LAB — POCT GLYCOSYLATED HEMOGLOBIN (HGB A1C): HEMOGLOBIN A1C: 5.1

## 2015-09-23 MED ORDER — CIPROFLOXACIN HCL 500 MG PO TABS: 500.0000 mg | ORAL_TABLET | Freq: Two times a day (BID) | ORAL | Status: DC

## 2015-09-23 MED ORDER — NAPROXEN 500 MG PO TABS: 500.0000 mg | ORAL_TABLET | Freq: Two times a day (BID) | ORAL | Status: DC

## 2015-09-23 NOTE — Progress Notes (Signed)
Subjective:  Patient ID: Seth Harris, male    DOB: Nov 06, 1975  Age: 40 y.o. MRN: [id number]  CC: Dysuria   HPI Seth Harris presents complaining of dysuria which he describes as "burning in his penis during micturition" and associated dribbling. He also complains of low back pain for the last few weeks. Review of his chart indicates he was treated for acute prostatitis on 08/31/15 and completed a course of ciprofloxacin; he reports that symptoms improved but then returned. He denies fevers, penile discharge; he has one sexual partner.  He works at a [employer] and does heavy lifting. Back pain does not radiate to lower extremities and he denies numbness or tingling in his legs.    Outpatient Prescriptions Prior to Visit  Medication Sig Dispense Refill  . ciprofloxacin (CIPRO) 500 MG tablet Take 1 tablet (500 mg total) by mouth 2 (two) times daily. (Patient not taking: Reported on 09/23/2015) 20 tablet 0   No facility-administered medications prior to visit.    ROS Review of Systems  Constitutional: Negative for activity change and appetite change.  HENT: Negative for sinus pressure and sore throat.   Eyes: Negative for visual disturbance.  Respiratory: Negative for cough, chest tightness and shortness of breath.   Cardiovascular: Negative for chest pain and leg swelling.  Gastrointestinal: Negative for abdominal pain, diarrhea, constipation and abdominal distention.  Endocrine: Negative.   Genitourinary:       See hpi  Musculoskeletal: Positive for back pain. Negative for myalgias and joint swelling.  Skin: Negative for rash.  Allergic/Immunologic: Negative.   Neurological: Negative for weakness, light-headedness and numbness.  Psychiatric/Behavioral: Negative for suicidal ideas and dysphoric mood.    Objective:  BP 126/81 mmHg  Pulse 75  Temp(Src) 98.5 F (36.9 C)  Resp 15  Ht 6' (1.829 m)  Wt 211 lb (95.709 kg)  BMI 28.61 kg/m2  SpO2 100%  BP/Weight  09/23/2015 08/31/2015 10/31/2014  Systolic BP 126 128 117  Diastolic BP 81 80 66  Wt. (Lbs) 211 209.6 193.8  BMI 28.61 28.42 26.28      Physical Exam  Constitutional: He is oriented to person, place, and time. He appears well-developed and well-nourished.  Cardiovascular: Normal rate, normal heart sounds and intact distal pulses.   No murmur heard. Pulmonary/Chest: Effort normal and breath sounds normal. He has no wheezes. He has no rales. He exhibits no tenderness.  Abdominal: Soft. Bowel sounds are normal. He exhibits no distension and no mass. There is no tenderness.  Musculoskeletal: Normal range of motion.  Neurological: He is alert and oriented to person, place, and time.    Urinalysis    Component Value Date/Time   BILIRUBINUR neg 09/23/2015 1444   BILIRUBINUR negative 08/31/2015 1010   KETONESUR negative 08/31/2015 1010   PROTEINUR neg 09/23/2015 1444   UROBILINOGEN 0.2 09/23/2015 1444   NITRITE neg 09/23/2015 1444   NITRITE Negative 08/31/2015 1010   LEUKOCYTESUR Negative 09/23/2015 1444      Assessment & Plan:   1. Dysuria Urinalysis is negative for UTI - Urinalysis Dipstick  2. Acute prostatitis Previously treated for prostatitis. We'll treat again and if symptoms persist he will need a urology referral at his next visit. - ciprofloxacin (CIPRO) 500 MG tablet; Take 1 tablet (500 mg total) by mouth 2 (two) times daily.  Dispense: 28 tablet; Refill: 0  3. Midline low back pain without sciatica Placed on NSAIDs   Meds ordered this encounter  Medications  . ciprofloxacin (CIPRO) 500 MG tablet  Sig: Take 1 tablet (500 mg total) by mouth 2 (two) times daily.    Dispense:  28 tablet    Refill:  0  . naproxen (NAPROSYN) 500 MG tablet    Sig: Take 1 tablet (500 mg total) by mouth 2 (two) times daily with a meal.    Dispense:  30 tablet    Refill:  1    Follow-up: Return in about 1 month (around 10/24/2015) for Follow up on prostatitis.   Jaclyn ShaggyEnobong Amao  MD

## 2015-09-23 NOTE — Patient Instructions (Signed)
Prostatitis (Prostatitis) La prstata tiene aproximadamente el tamao y la forma de una nuez. Se ubica justo debajo de la vejiga. Produce uno de los componentes del semen, formado por los espermatozoides y los lquidos que ayudan a nutrirlos y transportarlos fuera de los testculos. La prostatitis es una inflamacin de la prstata.  Hay cuatro tipos de prostatitis:  Prostatitis bacteriana aguda. Es el tipo menos frecuente de prostatitis. Comienza rpidamente y, por lo general, est acompaada por una infeccin de la vejiga, fiebre alta y escalofros. Puede ocurrir a cualquier edad.  Prostatitis bacteriana crnica. Es una infeccin bacteriana persistente en la prstata. Generalmente aparece luego de una prostatitis bacteriana aguda que se repite o que no fue tratada adecuadamente. Se puede presentar en hombres de cualquier edad pero es ms comn en los hombres de mediana edad cuya prstata ha comenzado a agrandarse. Los sntomas no son tan graves como los de la prostatitis bacteriana aguda. La principal molestia puede ser en la parte del cuerpo que est delante del recto y debajo del escroto (perineo), en la parte baja del abdomen o en la punta del pene (glande).   Prostatitis crnica (no bacteriana). Es el tipo ms frecuente de prostatitis. Es la inflamacin de la prstata y no se origina por una infeccin bacteriana. Se desconoce su causa y puede estar asociada con infecciones virales o trastornos autoinmunitarios.  Prostatodinia (trastorno del suelo plvico). Est asociada con un aumento del tono muscular en la pelvis que rodea a la prstata. CAUSAS La causa de la prostatitis bacteriana es una infeccin bacteriana. Se desconocen las causas de los otros tipos de prostatitis.  SNTOMAS  Los sntomas pueden variar segn el tipo de prostatitis. Tambin puede ocurrir que coincidan sntomas de diferentes tipos de prostatitis. Los sntomas posibles de cada tipo de prostatitis se enumeran a  continuacin. Prostatitis bacteriana aguda  Dolor al orinar.  Fiebre o escalofros.  Dolor muscular o en las articulaciones.  Dolor lumbar.  Dolor en la zona inferior del abdomen.  Imposibilidad de vaciar la vejiga completamente. Prostatitis bacteriana crnica, prostatitis crnica no bacteriana y prostatodinia  Necesidad urgente y repentina de orinar.  Ganas de orinar con frecuencia.  Dificultad para comenzar a eliminar la orina.  Chorro de orina dbil.  Secrecin por la uretra.  Goteo al terminar de orinar.  Dolor rectal.  Dolor en los testculos, el pene o la punta del pene.  Dolor en el perineo.  Problemas con la funcin sexual.  Eyaculacin dolorosa.  Semen con sangre. DIAGNSTICO  Su mdico le preguntar acerca de sus sntomas para hacer el diagnstico de prostatitis. Se recolectarn y analizarn una o ms muestras de orina (anlisis de orina). Si el resultado del anlisis de orina es negativo para bacterias, su mdico podr palpar su prstata con un dedo (examen dgito rectal). Este examen ayuda a su mdico a determinar si la prstata est inflamada y sensible. Tambin se obtendr una muestra de semen para analizarla. TRATAMIENTO  El tratamiento de la prostatitis depende de la causa. Si la causa es una infeccin bacteriana, se puede tratar con antibiticos. En los casos de prostatitis bacteriana crnica, puede ser necesario el uso de antibiticos durante hasta 1mes o 6semanas. Su mdico puede indicarle que tome baos de asiento para ayudar a aliviar el dolor. Un bao de asiento es un bao de agua caliente en la que las caderas y las nalgas estn bajo el agua. Esto relaja los msculos del suelo plvico y, a menudo, contribuye a aliviar la presin en la prstata.   INSTRUCCIONES PARA EL CUIDADO EN EL HOGAR   Tome todos los medicamentos como le indic el mdico.  Tome baos de asiento segn las indicaciones del mdico. SOLICITE ATENCIN MDICA SI:   Los sntomas  empeoran en lugar de mejorar.  Tiene fiebre. SOLICITE ATENCIN MDICA DE INMEDIATO SI:   Tiene escalofros.  Siente nuseas o vomita.  Se siente mareado o se desmaya.  No puede orinar.  Tiene sangre o cogulos de sangre en la orina. ASEGRESE DE QUE:  Comprende estas instrucciones.  Controlar su afeccin.  Recibir ayuda de inmediato si no mejora o si empeora.   Esta informacin no tiene como fin reemplazar el consejo del mdico. Asegrese de hacerle al mdico cualquier pregunta que tenga.   Document Released: 04/14/2005 Document Revised: 07/26/2014 Elsevier Interactive Patient Education 2016 Elsevier Inc.  

## 2015-09-23 NOTE — Progress Notes (Signed)
Patient complains of back pain and states his "penis hurts when he goes"

## 2015-09-29 ENCOUNTER — Ambulatory Visit: Payer: Self-pay

## 2015-10-09 ENCOUNTER — Ambulatory Visit: Payer: Self-pay | Attending: Family Medicine

## 2015-10-20 ENCOUNTER — Ambulatory Visit: Payer: Self-pay | Attending: Family Medicine | Admitting: Family Medicine

## 2015-10-20 ENCOUNTER — Encounter: Payer: Self-pay | Admitting: Family Medicine

## 2015-10-20 VITALS — BP 145/87 | HR 80 | Temp 98.1°F | Resp 15 | Ht 72.0 in | Wt 219.4 lb

## 2015-10-20 DIAGNOSIS — N411 Chronic prostatitis: Secondary | ICD-10-CM | POA: Insufficient documentation

## 2015-10-20 DIAGNOSIS — IMO0001 Reserved for inherently not codable concepts without codable children: Secondary | ICD-10-CM

## 2015-10-20 DIAGNOSIS — Z79899 Other long term (current) drug therapy: Secondary | ICD-10-CM | POA: Insufficient documentation

## 2015-10-20 DIAGNOSIS — R030 Elevated blood-pressure reading, without diagnosis of hypertension: Secondary | ICD-10-CM | POA: Insufficient documentation

## 2015-10-20 NOTE — Progress Notes (Signed)
Patient reports feeling better and has finished all medications

## 2015-10-20 NOTE — Patient Instructions (Signed)
Hypertension Hypertension, commonly called high blood pressure, is when the force of blood pumping through your arteries is too strong. Your arteries are the blood vessels that carry blood from your heart throughout your body. A blood pressure reading consists of a higher number over a lower number, such as 110/72. The higher number (systolic) is the pressure inside your arteries when your heart pumps. The lower number (diastolic) is the pressure inside your arteries when your heart relaxes. Ideally you want your blood pressure below 120/80. Hypertension forces your heart to work harder to pump blood. Your arteries may become narrow or stiff. Having untreated or uncontrolled hypertension can cause heart attack, stroke, kidney disease, and other problems. RISK FACTORS Some risk factors for high blood pressure are controllable. Others are not.  Risk factors you cannot control include:   Race. You may be at higher risk if you are African American.  Age. Risk increases with age.  Gender. Men are at higher risk than women before age 45 years. After age 65, women are at higher risk than men. Risk factors you can control include:  Not getting enough exercise or physical activity.  Being overweight.  Getting too much fat, sugar, calories, or salt in your diet.  Drinking too much alcohol. SIGNS AND SYMPTOMS Hypertension does not usually cause signs or symptoms. Extremely high blood pressure (hypertensive crisis) may cause headache, anxiety, shortness of breath, and nosebleed. DIAGNOSIS To check if you have hypertension, your health care provider will measure your blood pressure while you are seated, with your arm held at the level of your heart. It should be measured at least twice using the same arm. Certain conditions can cause a difference in blood pressure between your right and left arms. A blood pressure reading that is higher than normal on one occasion does not mean that you need treatment. If  it is not clear whether you have high blood pressure, you may be asked to return on a different day to have your blood pressure checked again. Or, you may be asked to monitor your blood pressure at home for 1 or more weeks. TREATMENT Treating high blood pressure includes making lifestyle changes and possibly taking medicine. Living a healthy lifestyle can help lower high blood pressure. You may need to change some of your habits. Lifestyle changes may include:  Following the DASH diet. This diet is high in fruits, vegetables, and whole grains. It is low in salt, red meat, and added sugars.  Keep your sodium intake below 2,300 mg per day.  Getting at least 30-45 minutes of aerobic exercise at least 4 times per week.  Losing weight if necessary.  Not smoking.  Limiting alcoholic beverages.  Learning ways to reduce stress. Your health care provider may prescribe medicine if lifestyle changes are not enough to get your blood pressure under control, and if one of the following is true:  You are 18-59 years of age and your systolic blood pressure is above 140.  You are 60 years of age or older, and your systolic blood pressure is above 150.  Your diastolic blood pressure is above 90.  You have diabetes, and your systolic blood pressure is over 140 or your diastolic blood pressure is over 90.  You have kidney disease and your blood pressure is above 140/90.  You have heart disease and your blood pressure is above 140/90. Your personal target blood pressure may vary depending on your medical conditions, your age, and other factors. HOME CARE INSTRUCTIONS    Have your blood pressure rechecked as directed by your health care provider.   Take medicines only as directed by your health care provider. Follow the directions carefully. Blood pressure medicines must be taken as prescribed. The medicine does not work as well when you skip doses. Skipping doses also puts you at risk for  problems.  Do not smoke.   Monitor your blood pressure at home as directed by your health care provider. SEEK MEDICAL CARE IF:   You think you are having a reaction to medicines taken.  You have recurrent headaches or feel dizzy.  You have swelling in your ankles.  You have trouble with your vision. SEEK IMMEDIATE MEDICAL CARE IF:  You develop a severe headache or confusion.  You have unusual weakness, numbness, or feel faint.  You have severe chest or abdominal pain.  You vomit repeatedly.  You have trouble breathing. MAKE SURE YOU:   Understand these instructions.  Will watch your condition.  Will get help right away if you are not doing well or get worse.   This information is not intended to replace advice given to you by your health care provider. Make sure you discuss any questions you have with your health care provider.   Document Released: 07/05/2005 Document Revised: 11/19/2014 Document Reviewed: 04/27/2013 Elsevier Interactive Patient Education 2016 Elsevier Inc.  

## 2015-10-20 NOTE — Progress Notes (Signed)
   Subjective:  Patient ID: Seth Harris, male    DOB: 10/16/1975  Age: 40 y.o. MRN: [id number]  CC: Follow-up   HPI Seth Harris received a 14 day course of ciprofloxacin at his last office visit for chronic pancreatitis prior to which he had been treated with ciprofloxacin at an urgent care. Today he reports improvement in symptoms.  He expresses Concern about the presence of a rash on the tip of his pain is but denies any discharge. He has had the same sexual partner for the last 11 years.  Blood pressure is elevated and he has no known history of hypertension.  Outpatient Prescriptions Prior to Visit  Medication Sig Dispense Refill  . ciprofloxacin (CIPRO) 500 MG tablet Take 1 tablet (500 mg total) by mouth 2 (two) times daily. (Patient not taking: Reported on 10/20/2015) 28 tablet 0  . naproxen (NAPROSYN) 500 MG tablet Take 1 tablet (500 mg total) by mouth 2 (two) times daily with a meal. (Patient not taking: Reported on 10/20/2015) 30 tablet 1   No facility-administered medications prior to visit.    ROS Review of Systems  Constitutional: Negative for activity change and appetite change.  HENT: Negative for sinus pressure and sore throat.   Respiratory: Negative for chest tightness, shortness of breath and wheezing.   Cardiovascular: Negative for chest pain and palpitations.  Gastrointestinal: Negative for abdominal pain, constipation and abdominal distention.  Genitourinary: Negative for penile swelling and penile pain.       Penile rash  Musculoskeletal: Negative.   Psychiatric/Behavioral: Negative for behavioral problems and dysphoric mood.    Objective:  BP 145/87 mmHg  Pulse 80  Temp(Src) 98.1 F (36.7 C)  Resp 15  Ht 6' (1.829 m)  Wt 219 lb 6.4 oz (99.519 kg)  BMI 29.75 kg/m2  SpO2 99%  BP/Weight 10/20/2015 09/23/2015 08/31/2015  Systolic BP 145 126 128  Diastolic BP 87 81 80  Wt. (Lbs) 219.4 211 209.6  BMI 29.75 28.61 28.42      Physical Exam    Constitutional: He is oriented to person, place, and time. He appears well-developed and well-nourished.  Cardiovascular: Normal rate, normal heart sounds and intact distal pulses.   No murmur heard. Pulmonary/Chest: Effort normal and breath sounds normal. He has no wheezes. He has no rales. He exhibits no tenderness.  Abdominal: Soft. Bowel sounds are normal. He exhibits no distension and no mass. There is no tenderness.  Genitourinary:  Fine rash on prepuce, no discharge noted no evidence of condyloma acuminata.  Musculoskeletal: Normal range of motion.  Neurological: He is alert and oriented to person, place, and time.     Assessment & Plan:   1. Elevated blood pressure Advised on lifestyle changes. Low-sodium, DASH diet, exercise We'll reassess at next visit and determine if antihypertensive if needed.  2. Chronic prostatitis Resolved. Discussed high risk behavior that could predispose to the above and the patient denies any of this.   No orders of the defined types were placed in this encounter.    Follow-up: Return in about 1 month (around 11/19/2015) for Follow-up on elevated blood pressure.   Jaclyn ShaggyEnobong Amao MD

## 2015-12-16 ENCOUNTER — Encounter: Payer: Self-pay | Admitting: Family Medicine

## 2015-12-16 ENCOUNTER — Ambulatory Visit: Payer: Self-pay | Attending: Family Medicine | Admitting: Family Medicine

## 2015-12-16 VITALS — BP 119/79 | HR 64 | Temp 98.2°F | Resp 18 | Ht 72.0 in | Wt 215.0 lb

## 2015-12-16 DIAGNOSIS — B356 Tinea cruris: Secondary | ICD-10-CM

## 2015-12-16 MED ORDER — CLOTRIMAZOLE 1 % EX CREA: 1.0000 "application " | TOPICAL_CREAM | Freq: Two times a day (BID) | CUTANEOUS | Status: DC

## 2015-12-16 NOTE — Progress Notes (Signed)
Pt here for F/U for high BP and prostate discomfort. Pt denies any pain today but reports itching in testicular area and rectum.

## 2015-12-16 NOTE — Progress Notes (Signed)
   Subjective:  Patient ID: Seth ForehandMarco Martinez, male    DOB: 07/06/1976  Age: 40 y.o. MRN: [id number]  CC: Follow-up   HPI Seth ForehandMarco Martinez presents forFollow-up of blood pressure which was elevated at his last office visit for which he has been on lifestyle modifications and blood pressure is normal today. He complains of pruritus in his perineum and extends in between his gluteal cleft but denies the presence of a rash or pain.  Symptoms have been on for the last few days.   Outpatient Prescriptions Prior to Visit  Medication Sig Dispense Refill  . naproxen (NAPROSYN) 500 MG tablet Take 1 tablet (500 mg total) by mouth 2 (two) times daily with a meal. (Patient not taking: Reported on 10/20/2015) 30 tablet 1   No facility-administered medications prior to visit.    ROS Review of Systems  Constitutional: Negative for activity change and appetite change.  HENT: Negative for sinus pressure and sore throat.   Respiratory: Negative for chest tightness, shortness of breath and wheezing.   Cardiovascular: Negative for chest pain and palpitations.  Gastrointestinal: Negative for abdominal pain, constipation and abdominal distention.  Genitourinary: Negative.   Musculoskeletal: Negative.   Skin:       See hpi  Psychiatric/Behavioral: Negative for behavioral problems and dysphoric mood.    Objective:  BP 119/79 mmHg  Pulse 64  Temp(Src) 98.2 F (36.8 C) (Oral)  Resp 18  Ht 6' (1.829 m)  Wt 215 lb (97.523 kg)  BMI 29.15 kg/m2  SpO2 100%  BP/Weight 12/16/2015 10/20/2015 09/23/2015  Systolic BP 119 145 126  Diastolic BP 79 87 81  Wt. (Lbs) 215 219.4 211  BMI 29.15 29.75 28.61      Physical Exam  Constitutional: He is oriented to person, place, and time. He appears well-developed and well-nourished.  Cardiovascular: Normal rate, normal heart sounds and intact distal pulses.   No murmur heard. Pulmonary/Chest: Effort normal and breath sounds normal. He has no wheezes. He has no rales. He  exhibits no tenderness.  Abdominal: Soft. Bowel sounds are normal. He exhibits no distension and no mass. There is no tenderness.  Genitourinary:  No rash evident in perineum  Musculoskeletal: Normal range of motion.  Neurological: He is alert and oriented to person, place, and time.     Assessment & Plan:   1. Tinea cruris Discussed wearing loosefitting clothing - clotrimazole (LOTRIMIN) 1 % cream; Apply 1 application topically 2 (two) times daily.  Dispense: 30 g; Refill: 0   Meds ordered this encounter  Medications  . clotrimazole (LOTRIMIN) 1 % cream    Sig: Apply 1 application topically 2 (two) times daily.    Dispense:  30 g    Refill:  0    Follow-up: Return in about 3 months (around 03/17/2016), or if symptoms worsen or fail to improve, for Follow-up on elevated blood pressure.   Jaclyn ShaggyEnobong Amao MD

## 2015-12-16 NOTE — Patient Instructions (Signed)
Tiña inguinal  (Jock Itch)  La tiña inguinal (tiña crural) es una micosis de la piel que aparece en la zona de la ingle. En ocasiones recibe el nombre de dermatofitosis de la ingle. Es causada por un hongo, que es un tipo de bacteria que se desarrolla en lugares oscuros y húmedos. La tiña inguinal produce una erupción cutánea y picazón en la ingle y la zona alta del muslo. Generalmente, desaparece después de 2 o 3 semanas de tratamiento.  CAUSAS  El hongo que causa la tiña inguinal puede diseminarse de la siguiente forma:  · Al tocar una micosis en cualquier otra parte del cuerpo, por ejemplo, el pie de atleta, y luego tocarse la zona de la ingle.  · Compartir las toallas o la ropa con una persona infectada.  FACTORES DE RIESGO  La tiña inguinal es más frecuente en los hombres adultos y adolescentes. Es más probable que esta afección aparezca debido a lo siguiente:  · Estar en climas cálidos y húmedos.  · Usar ropa ajustada o trajes de baño húmedos durante períodos largos de tiempo.  · Practicar deportes.  · Tener sobrepeso.  · Tener diabetes.  SÍNTOMAS  Los síntomas de la tiña inguinal pueden incluir lo siguiente:  · Una erupción cutánea de color rojo, rosa o marrón en la zona inguinal. La erupción puede extenderse a los muslos, el ano y los glúteos.  · Piel seca y escamosa en la erupción cutánea o alrededor de esta.  · Picazón.  DIAGNÓSTICO  La mayoría de las veces, un médico puede hacer el diagnóstico al observar la erupción cutánea. En ocasiones, se hará un raspado de la piel infectada. Para analizar esta muestra, se la estudia con un microscopio o se intenta hacer crecer el hongo de la muestra (cultivo).   TRATAMIENTO  El tratamiento para esta afección puede incluir lo siguiente:  · Antimicóticos para eliminar el hongo. Estos medicamentos pueden tener varias presentaciones:    Crema o ungüento para la piel.    Medicamentos por vía oral.  · Crema o ungüento para la piel para aliviar la picazón.  · Compresas o  talcos medicinales para secar la piel infectada.  INSTRUCCIONES PARA EL CUIDADO EN EL HOGAR  · Tome los medicamentos solamente como se lo haya indicado el médico. Aplique las cremas o los ungüentos para la piel exactamente como se lo hayan indicado.  · Use ropas sueltas.    Los hombres deben usar calzoncillos tipo bóxer de algodón y    las mujeres, ropa interior de este mismo material.  · Cámbiese la ropa interior todos los días para mantener seca la zona de la ingle.  · No se dé baños de inmersión calientes.  · Séquese bien la zona inguinal después de bañarse.    Use una toalla diferente para secarse la zona afectada. Esto evitará que la infección se disemine a otras zonas del cuerpo.  · No se rasque la zona afectada.  · No comparta las toallas con otras personas.  SOLICITE ATENCIÓN MÉDICA SI:  · La erupción cutánea no mejora o empeora después de 2 semanas de tratamiento.  · La erupción cutánea se está extendiendo.  · La erupción cutánea vuelve a aparecer después de finalizado el tratamiento.  · Tiene fiebre.  · Tiene enrojecimiento, hinchazón o dolor alrededor de la erupción cutánea.  · Observa líquido, sangre o pus que salen de la erupción cutánea.  · Tiene la erupción cutánea durante más de 4 semanas.     Esta información no tiene   como fin reemplazar el consejo del médico. Asegúrese de hacerle al médico cualquier pregunta que tenga.     Document Released: 04/14/2005 Document Revised: 07/26/2014  Elsevier Interactive Patient Education ©2016 Elsevier Inc.

## 2016-05-05 ENCOUNTER — Ambulatory Visit: Payer: Self-pay | Attending: Internal Medicine

## 2016-06-09 ENCOUNTER — Ambulatory Visit: Payer: Self-pay | Attending: Family Medicine | Admitting: Family Medicine

## 2016-06-09 ENCOUNTER — Encounter: Payer: Self-pay | Admitting: Family Medicine

## 2016-06-09 VITALS — BP 132/73 | HR 72 | Temp 98.3°F | Ht 72.0 in | Wt 220.8 lb

## 2016-06-09 DIAGNOSIS — Z79899 Other long term (current) drug therapy: Secondary | ICD-10-CM | POA: Insufficient documentation

## 2016-06-09 DIAGNOSIS — M25562 Pain in left knee: Secondary | ICD-10-CM | POA: Insufficient documentation

## 2016-06-09 DIAGNOSIS — Z13228 Encounter for screening for other metabolic disorders: Secondary | ICD-10-CM | POA: Insufficient documentation

## 2016-06-09 DIAGNOSIS — K219 Gastro-esophageal reflux disease without esophagitis: Secondary | ICD-10-CM | POA: Insufficient documentation

## 2016-06-09 DIAGNOSIS — M25561 Pain in right knee: Secondary | ICD-10-CM | POA: Insufficient documentation

## 2016-06-09 LAB — COMPLETE METABOLIC PANEL WITH GFR
ALBUMIN: 4.3 g/dL (ref 3.6–5.1)
ALK PHOS: 52 U/L (ref 40–115)
ALT: 26 U/L (ref 9–46)
AST: 26 U/L (ref 10–40)
BILIRUBIN TOTAL: 0.8 mg/dL (ref 0.2–1.2)
BUN: 12 mg/dL (ref 7–25)
CO2: 27 mmol/L (ref 20–31)
Calcium: 9.2 mg/dL (ref 8.6–10.3)
Chloride: 104 mmol/L (ref 98–110)
Creat: 0.93 mg/dL (ref 0.60–1.35)
GFR, Est African American: >89 mL/min (ref >=60)
GFR, Est Non African American: >89 mL/min (ref >=60)
GLUCOSE: 98 mg/dL (ref 65–99)
Potassium: 4.2 mmol/L (ref 3.5–5.3)
SODIUM: 138 mmol/L (ref 135–146)
TOTAL PROTEIN: 7.5 g/dL (ref 6.1–8.1)

## 2016-06-09 MED ORDER — NAPROXEN 500 MG PO TABS: 500.0000 mg | ORAL_TABLET | Freq: Two times a day (BID) | ORAL | 1 refills | Status: DC

## 2016-06-09 MED ORDER — OMEPRAZOLE 20 MG PO CPDR: 20.0000 mg | DELAYED_RELEASE_CAPSULE | Freq: Every day | ORAL | 3 refills | Status: DC

## 2016-06-09 NOTE — Patient Instructions (Signed)
Dolor de rodilla (Knee Pain) El dolor de rodilla es un sntoma muy comn y puede tener muchas causas. Suele desaparecer cuando se siguen las indicaciones del mdico en lo que respecta a aliviar el dolor y las molestias en la casa. Sin embargo, puede progresar hasta convertirse en una afeccin que requiere tratamiento. Algunas afecciones pueden incluir lo siguiente:  Artritis por uso y desgaste (artrosis).  Artritis por hinchazn e irritacin (artritis reumatoide o gota).  Un quiste o un crecimiento en la rodilla.  Una infeccin en la articulacin de la rodilla.  Una lesin que no se cura.  Dao, hinchazn o irritacin de los tejidos que sostienen la rodilla (distensin de ligamentos o tendinitis). Si el dolor de rodilla persiste, tal vez haya que realizar ms estudios para diagnosticar la afeccin, los cuales pueden incluir radiografas u otros estudios de diagnstico por imgenes de la rodilla. Adems, es posible que haya que extraerle lquido de la rodilla. El tratamiento del dolor continuo de rodilla depende de la causa, pero puede incluir lo siguiente:  Medicamentos para aliviar el dolor o reducir la hinchazn.  Inyecciones de corticoides en la rodilla.  Fisioterapia.  Ciruga. INSTRUCCIONES PARA EL CUIDADO EN EL HOGAR  Tome los medicamentos solamente como se lo haya indicado el mdico.  Mantenga la rodilla en reposo y en alto (elevada) mientras est descansando.  No haga cosas que le causen dolor o que lo intensifiquen.  Evite las actividades o los ejercicios de alto impacto, como correr, saltar la soga o hacer saltos de tijera.  Aplique hielo sobre la zona de la rodilla: ? Ponga el hielo en una bolsa plstica. ? Coloque una toalla entre la piel y la bolsa de hielo. ? Coloque el hielo durante 20 minutos, 2 a 3 veces por da.  Pregntele al mdico si debe usar una rodillera elstica.  Cuando duerma, pngase una almohada debajo de la rodilla.  Baje de peso si es  necesario. El peso extra puede generar presin en la rodilla.  No consuma ningn producto que contenga tabaco, lo que incluye cigarrillos, tabaco de mascar o cigarrillos electrnicos. Si necesita ayuda para dejar de fumar, consulte al mdico. Fumar puede retrasar la curacin de cualquier problema que tenga en el hueso y la articulacin. SOLICITE ATENCIN MDICA SI:  El dolor de rodilla contina, cambia o empeora.  Tiene fiebre junto con dolor de rodilla.  La rodilla se le tuerce o se le traba.  La rodilla est ms hinchada. SOLICITE ATENCIN MDICA DE INMEDIATO SI:   La articulacin de la rodilla est caliente al tacto.  Tiene dolor en el pecho o dificultad para respirar. Esta informacin no tiene como fin reemplazar el consejo del mdico. Asegrese de hacerle al mdico cualquier pregunta que tenga. Document Released: 12/22/2007 Document Revised: 07/26/2014 Elsevier Interactive Patient Education  2017 [employer] Inc.  

## 2016-06-09 NOTE — Progress Notes (Signed)
Subjective:  Patient ID: Seth Harris, male    DOB: 11/30/1975  Age: 40 y.o. MRN: [id number]  CC: Knee Pain (L>R) and Gastroesophageal Reflux   HPI Seth Harris presents Complaining of bilateral knee pain (left greater than right) for the last month. At his job he has to be on his knees a lot laying down floors; he noticed while playing with his son that he developed a sharp pain in his left knee. Denies swelling.  Also complains of reflux symptoms but denies abdominal pain, nausea or vomiting. Has no bleeding in stools, constipation or diarrhea. Denies eating late; denies at 6:54 PM and bedtime is at 10 PM  Past Medical History:  Diagnosis Date  . Allergy     Past Surgical History:  Procedure Laterality Date  . HERNIA REPAIR      No Known Allergies   Outpatient Medications Prior to Visit  Medication Sig Dispense Refill  . clotrimazole (LOTRIMIN) 1 % cream Apply 1 application topically 2 (two) times daily. (Patient not taking: Reported on 06/09/2016) 30 g 0  . naproxen (NAPROSYN) 500 MG tablet Take 1 tablet (500 mg total) by mouth 2 (two) times daily with a meal. (Patient not taking: Reported on 06/09/2016) 30 tablet 1   No facility-administered medications prior to visit.     ROS Review of Systems  Constitutional: Negative for activity change and appetite change.  HENT: Negative for sinus pressure and sore throat.   Respiratory: Negative for chest tightness, shortness of breath and wheezing.   Cardiovascular: Negative for chest pain and palpitations.  Gastrointestinal: Negative for abdominal distention, abdominal pain and constipation.       Positive for reflux  Genitourinary: Negative.   Musculoskeletal:       See hpi  Psychiatric/Behavioral: Negative for behavioral problems and dysphoric mood.    Objective:  BP 132/73 (BP Location: Right Arm, Patient Position: Sitting, Cuff Size: Small)   Pulse 72   Temp 98.3 F (36.8 C) (Oral)   Ht 6' (1.829 m)   Wt 220  lb 12.8 oz (100.2 kg)   SpO2 100%   BMI 29.95 kg/m   BP/Weight 06/09/2016 12/16/2015 10/20/2015  Systolic BP 132 119 145  Diastolic BP 73 79 87  Wt. (Lbs) 220.8 215 219.4  BMI 29.95 29.15 29.75      Physical Exam  Constitutional: He is oriented to person, place, and time. He appears well-developed and well-nourished.  Cardiovascular: Normal rate, normal heart sounds and intact distal pulses.   No murmur heard. Pulmonary/Chest: Effort normal and breath sounds normal. He has no wheezes. He has no rales. He exhibits no tenderness.  Abdominal: Soft. Bowel sounds are normal. He exhibits no distension and no mass. There is no tenderness.  Musculoskeletal: Normal range of motion. He exhibits no edema.  Crepitus on ROM No tenderness on ROM  Neurological: He is alert and oriented to person, place, and time.     Assessment & Plan:   1. Acute pain of both knees Advised to use knee pads when laying down floors Cannot exclude underlying osteoarthritis - naproxen (NAPROSYN) 500 MG tablet; Take 1 tablet (500 mg total) by mouth 2 (two) times daily with a meal.  Dispense: 30 tablet; Refill: 1  2. Gastroesophageal reflux disease without esophagitis Avoid late meals - omeprazole (PRILOSEC) 20 MG capsule; Take 1 capsule (20 mg total) by mouth daily.  Dispense: 30 capsule; Refill: 3  3. Screening for metabolic disorder - COMPLETE METABOLIC PANEL WITH GFR   Meds  ordered this encounter  Medications  . naproxen (NAPROSYN) 500 MG tablet    Sig: Take 1 tablet (500 mg total) by mouth 2 (two) times daily with a meal.    Dispense:  30 tablet    Refill:  1  . omeprazole (PRILOSEC) 20 MG capsule    Sig: Take 1 capsule (20 mg total) by mouth daily.    Dispense:  30 capsule    Refill:  3    Follow-up: Return in about 6 weeks (around 07/21/2016), or if symptoms worsen or fail to improve, for follow up of GERD and Knee pain.   Jaclyn ShaggyEnobong Amao MD

## 2016-06-17 ENCOUNTER — Telehealth: Payer: Self-pay

## 2016-06-17 NOTE — Telephone Encounter (Signed)
-----   Message from Jaclyn ShaggyEnobong Amao, MD sent at 06/14/2016  2:25 PM EST ----- Please inform the patient that labs are normal. Thank you.

## 2016-06-17 NOTE — Telephone Encounter (Signed)
Writer called patient through [employer] per Dr. Venetia NightAmao.  A VM was left for patient indicating that writer was trying to get a hold of him to discuss his results.

## 2016-06-18 ENCOUNTER — Telehealth: Payer: Self-pay | Admitting: Family Medicine

## 2016-06-18 NOTE — Telephone Encounter (Signed)
Pt. Came into facility requesting his lab results and per nurse Joyce GrossKay he was informed of his labs being normal.

## 2016-07-22 ENCOUNTER — Ambulatory Visit: Payer: Self-pay | Attending: Family Medicine

## 2016-08-12 ENCOUNTER — Ambulatory Visit: Payer: Self-pay | Attending: Family Medicine | Admitting: Family Medicine

## 2016-08-12 ENCOUNTER — Encounter: Payer: Self-pay | Admitting: Family Medicine

## 2016-08-12 ENCOUNTER — Ambulatory Visit (HOSPITAL_COMMUNITY)
Admission: RE | Admit: 2016-08-12 | Discharge: 2016-08-12 | Disposition: A | Discharge status: Home / Self Care | Payer: Self-pay | Source: Ambulatory Visit | Attending: Family Medicine | Admitting: Family Medicine

## 2016-08-12 DIAGNOSIS — G8929 Other chronic pain: Secondary | ICD-10-CM

## 2016-08-12 DIAGNOSIS — Z79899 Other long term (current) drug therapy: Secondary | ICD-10-CM | POA: Insufficient documentation

## 2016-08-12 DIAGNOSIS — K219 Gastro-esophageal reflux disease without esophagitis: Secondary | ICD-10-CM | POA: Insufficient documentation

## 2016-08-12 DIAGNOSIS — M25562 Pain in left knee: Secondary | ICD-10-CM | POA: Insufficient documentation

## 2016-08-12 DIAGNOSIS — M25561 Pain in right knee: Secondary | ICD-10-CM | POA: Insufficient documentation

## 2016-08-12 MED ORDER — OMEPRAZOLE 20 MG PO CPDR: 20.0000 mg | DELAYED_RELEASE_CAPSULE | Freq: Every day | ORAL | 3 refills | Status: DC

## 2016-08-12 MED ORDER — NAPROXEN 500 MG PO TABS: 500.0000 mg | ORAL_TABLET | Freq: Two times a day (BID) | ORAL | 2 refills | Status: DC

## 2016-08-12 NOTE — Patient Instructions (Signed)
Dolor de rodilla (Knee Pain) El dolor de rodilla es un sntoma muy comn y puede tener muchas causas. Suele desaparecer cuando se siguen las indicaciones del mdico en lo que respecta a aliviar el dolor y las molestias en la casa. Sin embargo, puede progresar hasta convertirse en una afeccin que requiere tratamiento. Algunas afecciones pueden incluir lo siguiente:  Artritis por uso y desgaste (artrosis).  Artritis por hinchazn e irritacin (artritis reumatoide o gota).  Un quiste o un crecimiento en la rodilla.  Una infeccin en la articulacin de la rodilla.  Una lesin que no se cura.  Dao, hinchazn o irritacin de los tejidos que sostienen la rodilla (distensin de ligamentos o tendinitis). Si el dolor de rodilla persiste, tal vez haya que realizar ms estudios para diagnosticar la afeccin, los cuales pueden incluir radiografas u otros estudios de diagnstico por imgenes de la rodilla. Adems, es posible que haya que extraerle lquido de la rodilla. El tratamiento del dolor continuo de rodilla depende de la causa, pero puede incluir lo siguiente:  Medicamentos para aliviar el dolor o reducir la hinchazn.  Inyecciones de corticoides en la rodilla.  Fisioterapia.  Ciruga. INSTRUCCIONES PARA EL CUIDADO EN EL HOGAR  Tome los medicamentos solamente como se lo haya indicado el mdico.  Mantenga la rodilla en reposo y en alto (elevada) mientras est descansando.  No haga cosas que le causen dolor o que lo intensifiquen.  Evite las actividades o los ejercicios de alto impacto, como correr, saltar la soga o hacer saltos de tijera.  Aplique hielo sobre la zona de la rodilla: ? Ponga el hielo en una bolsa plstica. ? Coloque una toalla entre la piel y la bolsa de hielo. ? Coloque el hielo durante 20 minutos, 2 a 3 veces por da.  Pregntele al mdico si debe usar una rodillera elstica.  Cuando duerma, pngase una almohada debajo de la rodilla.  Baje de peso si es  necesario. El peso extra puede generar presin en la rodilla.  No consuma ningn producto que contenga tabaco, lo que incluye cigarrillos, tabaco de mascar o cigarrillos electrnicos. Si necesita ayuda para dejar de fumar, consulte al mdico. Fumar puede retrasar la curacin de cualquier problema que tenga en el hueso y la articulacin. SOLICITE ATENCIN MDICA SI:  El dolor de rodilla contina, cambia o empeora.  Tiene fiebre junto con dolor de rodilla.  La rodilla se le tuerce o se le traba.  La rodilla est ms hinchada. SOLICITE ATENCIN MDICA DE INMEDIATO SI:   La articulacin de la rodilla est caliente al tacto.  Tiene dolor en el pecho o dificultad para respirar. Esta informacin no tiene como fin reemplazar el consejo del mdico. Asegrese de hacerle al mdico cualquier pregunta que tenga. Document Released: 12/22/2007 Document Revised: 07/26/2014 Elsevier Interactive Patient Education  2017 [employer] Inc.  

## 2016-08-12 NOTE — Progress Notes (Signed)
Subjective:  Patient ID: Seth Harris, male    DOB: [birth year]  Age: 41 y.o. MRN: [id number]  CC: Knee Pain   HPI Seth Harris presents for follow-up of bilateral knee pain (left greater than right) for which he has remained on naproxen and reports slight improvement in symptoms but he has also had to use an OTC analgesic in addition. Pain at this time is 3/10; he denies swelling of his knees. 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  He also requests refill of PPI for GERD and reports symptoms controlled.  Past Medical History:  Diagnosis Date  . Allergy     Past Surgical History:  Procedure Laterality Date  . HERNIA REPAIR      No Known Allergies   Outpatient Medications Prior to Visit  Medication Sig Dispense Refill  . naproxen (NAPROSYN) 500 MG tablet Take 1 tablet (500 mg total) by mouth 2 (two) times daily with a meal. 30 tablet 1  . omeprazole (PRILOSEC) 20 MG capsule Take 1 capsule (20 mg total) by mouth daily. 30 capsule 3   No facility-administered medications prior to visit.     ROS Review of Systems  Constitutional: Negative for activity change and appetite change.  HENT: Negative for sinus pressure and sore throat.   Respiratory: Negative for chest tightness, shortness of breath and wheezing.   Cardiovascular: Negative for chest pain and palpitations.  Gastrointestinal: Negative for abdominal distention, abdominal pain and constipation.  Genitourinary: Negative.   Musculoskeletal:       See hpi  Psychiatric/Behavioral: Negative for behavioral problems and dysphoric mood.    Objective:  BP 123/73 (BP Location: Right Arm, Patient Position: Sitting, Cuff Size: Normal)   Pulse 68   Temp 98.5 F (36.9 C) (Oral)   Resp 18   Ht 6' (1.829 m)   Wt 229 lb (103.9 kg)   SpO2 100%   BMI 31.06 kg/m   BP/Weight 08/12/2016 06/09/2016 12/16/2015  Systolic BP 123 132 119  Diastolic BP 73 73 79  Wt. (Lbs) 229 220.8 215    BMI 31.06 29.95 29.15      Physical Exam  Constitutional: He is oriented to person, place, and time. He appears well-developed and well-nourished.  Cardiovascular: Normal rate, normal heart sounds and intact distal pulses.   No murmur heard. Pulmonary/Chest: Effort normal and breath sounds normal. He has no wheezes. He has no rales. He exhibits no tenderness.  Abdominal: Soft. Bowel sounds are normal. He exhibits no distension and no mass. There is no tenderness.  Musculoskeletal: Normal range of motion.  Neurological: He is alert and oriented to person, place, and time.     Assessment & Plan:   1. Chronic pain of both knees Pain is minimal at this time Apply ice as needed - DG Knee Complete 4 Views Left; Future - DG Knee Complete 4 Views Right; Future - naproxen (NAPROSYN) 500 MG tablet; Take 1 tablet (500 mg total) by mouth 2 (two) times daily with a meal.  Dispense: 30 tablet; Refill: 2  2. Gastroesophageal reflux disease without esophagitis Controlled - omeprazole (PRILOSEC) 20 MG capsule; Take 1 capsule (20 mg total) by mouth daily.  Dispense: 30 capsule; Refill: 3   Meds ordered this encounter  Medications  . naproxen (NAPROSYN) 500 MG tablet    Sig: Take 1 tablet (500 mg total) by mouth 2 (two) times daily with a meal.    Dispense:  30  tablet    Refill:  2  . omeprazole (PRILOSEC) 20 MG capsule    Sig: Take 1 capsule (20 mg total) by mouth daily.    Dispense:  30 capsule    Refill:  3    Follow-up: Return in about 3 weeks (around 09/02/2016) for complete physical exam.   Jaclyn ShaggyEnobong Amao MD

## 2016-08-19 ENCOUNTER — Telehealth: Payer: Self-pay

## 2016-08-19 NOTE — Telephone Encounter (Signed)
-----   Message from Jaclyn ShaggyEnobong Amao, MD sent at 08/13/2016  8:34 AM EST ----- Mild degenerative changes in the right knee, minimal spur formation. Left knee x-ray is normal.

## 2016-08-19 NOTE — Telephone Encounter (Signed)
Writer called patient through [employer] and was able to inform him of his results.  Patient stated understanding.

## 2016-08-26 ENCOUNTER — Encounter: Payer: Self-pay | Admitting: Family Medicine

## 2016-08-26 ENCOUNTER — Ambulatory Visit: Payer: Self-pay | Attending: Family Medicine | Admitting: Family Medicine

## 2016-08-26 VITALS — BP 122/70 | HR 66 | Temp 98.4°F | Ht 73.0 in | Wt 223.6 lb

## 2016-08-26 DIAGNOSIS — Z1159 Encounter for screening for other viral diseases: Secondary | ICD-10-CM

## 2016-08-26 DIAGNOSIS — Z13228 Encounter for screening for other metabolic disorders: Secondary | ICD-10-CM | POA: Insufficient documentation

## 2016-08-26 DIAGNOSIS — Z114 Encounter for screening for human immunodeficiency virus [HIV]: Secondary | ICD-10-CM | POA: Insufficient documentation

## 2016-08-26 DIAGNOSIS — Z0000 Encounter for general adult medical examination without abnormal findings: Secondary | ICD-10-CM | POA: Insufficient documentation

## 2016-08-26 DIAGNOSIS — Z79899 Other long term (current) drug therapy: Secondary | ICD-10-CM | POA: Insufficient documentation

## 2016-08-26 LAB — CBC WITH DIFFERENTIAL/PLATELET
BASOS ABS: 0 {cells}/uL (ref 0–200)
BASOS PCT: 0 %
EOS ABS: 92 {cells}/uL (ref 15–500)
Eosinophils Relative: 2 %
HCT: 49 % (ref 38.5–50.0)
HEMOGLOBIN: 16.7 g/dL (ref 13.2–17.1)
LYMPHS ABS: 1840 {cells}/uL (ref 850–3900)
Lymphocytes Relative: 40 %
MCH: 30.1 pg (ref 27.0–33.0)
MCHC: 34.1 g/dL (ref 32.0–36.0)
MCV: 88.3 fL (ref 80.0–100.0)
MPV: 11.6 fL (ref 7.5–12.5)
Monocytes Absolute: 276 cells/uL (ref 200–950)
Monocytes Relative: 6 %
Neutro Abs: 2392 cells/uL (ref 1500–7800)
Neutrophils Relative %: 52 %
Platelets: 141 10*3/uL (ref 140–400)
RBC: 5.55 MIL/uL (ref 4.20–5.80)
RDW: 13.3 % (ref 11.0–15.0)
WBC: 4.6 10*3/uL (ref 3.8–10.8)

## 2016-08-26 LAB — COMPLETE METABOLIC PANEL WITH GFR
ALBUMIN: 4.6 g/dL (ref 3.6–5.1)
ALK PHOS: 53 U/L (ref 40–115)
ALT: 27 U/L (ref 9–46)
AST: 24 U/L (ref 10–40)
BILIRUBIN TOTAL: 0.7 mg/dL (ref 0.2–1.2)
BUN: 15 mg/dL (ref 7–25)
CALCIUM: 9.5 mg/dL (ref 8.6–10.3)
CO2: 27 mmol/L (ref 20–31)
CREATININE: 1.03 mg/dL (ref 0.60–1.35)
Chloride: 105 mmol/L (ref 98–110)
GFR, Est Non African American: 89 mL/min (ref >=60)
Glucose, Bld: 100 mg/dL — ABNORMAL HIGH (ref 65–99)
Potassium: 4 mmol/L (ref 3.5–5.3)
Sodium: 140 mmol/L (ref 135–146)
TOTAL PROTEIN: 7.7 g/dL (ref 6.1–8.1)

## 2016-08-26 LAB — POCT URINALYSIS DIPSTICK
BILIRUBIN UA: NEGATIVE
Glucose, UA: NEGATIVE
KETONES UA: NEGATIVE
Leukocytes, UA: NEGATIVE
Nitrite, UA: NEGATIVE
PH UA: 5.5
PROTEIN UA: NEGATIVE
RBC UA: NEGATIVE
Spec Grav, UA: 1.02
Urobilinogen, UA: 0.2

## 2016-08-26 LAB — LIPID PANEL W/REFLEX DIRECT LDL
CHOLESTEROL: 117 mg/dL (ref <200)
HDL: 34 mg/dL — ABNORMAL LOW (ref >40)
LDL-Cholesterol: 65 mg/dL
Non-HDL Cholesterol (Calc): 83 mg/dL (ref <130)
TRIGLYCERIDES: 99 mg/dL (ref <150)
Total CHOL/HDL Ratio: 3.4 Ratio (ref <5.0)

## 2016-08-26 LAB — TSH: TSH: 1.35 mIU/L (ref 0.40–4.50)

## 2016-08-26 NOTE — Progress Notes (Signed)
   Subjective:  Patient ID: Seth Harris, male    DOB: 02/07/1976  Age: 41 y.o. MRN: [id number]  CC: Annual Exam   HPI Seth Harris presents for a complete physical exam.  Past Medical History:  Diagnosis Date  . Allergy     Past Surgical History:  Procedure Laterality Date  . HERNIA REPAIR      No Known Allergies   Outpatient Medications Prior to Visit  Medication Sig Dispense Refill  . naproxen (NAPROSYN) 500 MG tablet Take 1 tablet (500 mg total) by mouth 2 (two) times daily with a meal. 30 tablet 2  . omeprazole (PRILOSEC) 20 MG capsule Take 1 capsule (20 mg total) by mouth daily. 30 capsule 3   No facility-administered medications prior to visit.     ROS Review of Systems  Constitutional: Negative for activity change and appetite change.  HENT: Negative for sinus pressure and sore throat.   Eyes: Negative for visual disturbance.  Respiratory: Negative for cough, chest tightness and shortness of breath.   Cardiovascular: Negative for chest pain and leg swelling.  Gastrointestinal: Negative for abdominal distention, abdominal pain, constipation and diarrhea.  Endocrine: Negative.   Genitourinary: Negative for dysuria.  Musculoskeletal: Negative for joint swelling and myalgias.  Skin: Negative for rash.  Allergic/Immunologic: Negative.   Neurological: Negative for weakness, light-headedness and numbness.  Psychiatric/Behavioral: Negative for dysphoric mood and suicidal ideas.    Objective:  BP 122/70 (BP Location: Right Arm, Patient Position: Sitting, Cuff Size: Large)   Pulse 66   Temp 98.4 F (36.9 C) (Oral)   Ht 6\' 1"  (1.854 m)   Wt 223 lb 9.6 oz (101.4 kg)   SpO2 98%   BMI 29.50 kg/m   BP/Weight 08/26/2016 08/12/2016 06/09/2016  Systolic BP 122 123 132  Diastolic BP 70 73 73  Wt. (Lbs) 223.6 229 220.8  BMI 29.5 31.06 29.95      Physical Exam  Constitutional: He is oriented to person, place, and time. He appears well-developed and  well-nourished.  HENT:  Head: Normocephalic and atraumatic.  Right Ear: External ear normal.  Left Ear: External ear normal.  Eyes: Conjunctivae and EOM are normal. Pupils are equal, round, and reactive to light.  Neck: Normal range of motion. Neck supple. No tracheal deviation present.  Cardiovascular: Normal rate, regular rhythm and normal heart sounds.   No murmur heard. Pulmonary/Chest: Effort normal and breath sounds normal. No respiratory distress. He has no wheezes. He exhibits no tenderness.  Abdominal: Soft. Bowel sounds are normal. He exhibits no mass. There is no tenderness.  Genitourinary: Prostate normal and penis normal.  Genitourinary Comments: Healed bilateral inguinal herniorrhaphy scars  Musculoskeletal: Normal range of motion. He exhibits no edema or tenderness.  Neurological: He is alert and oriented to person, place, and time.  Skin: Skin is warm and dry.  Psychiatric: He has a normal mood and affect.     Assessment & Plan:   1. Annual physical exam - CBC with Differential/Platelet - TSH  2. Screening for viral disease - HIV antibody  3. Screening for metabolic disorder - COMPLETE METABOLIC PANEL WITH GFR - Lipid Panel w/reflex Direct LDL   No orders of the defined types were placed in this encounter.   Follow-up: Return in about 3 months (around 11/23/2016) for Follow-up of knee pain.   Jaclyn ShaggyEnobong Amao MD

## 2016-08-27 LAB — HIV ANTIBODY (ROUTINE TESTING W REFLEX): HIV: NONREACTIVE

## 2016-09-10 ENCOUNTER — Telehealth: Payer: Self-pay | Admitting: Family Medicine

## 2016-09-10 NOTE — Telephone Encounter (Signed)
Pt. Came into facility requesting his lab results from his last OV. Please f/u

## 2016-09-14 ENCOUNTER — Telehealth: Payer: Self-pay

## 2016-09-14 NOTE — Telephone Encounter (Signed)
-----   Message from Jaclyn ShaggyEnobong Amao, MD sent at 08/27/2016 12:57 PM EST ----- Please inform the patient that labs are normal. Thank you.

## 2016-09-14 NOTE — Telephone Encounter (Signed)
Through [employer] writer was able to contact patient and discuss his lab results.  Patient was very grateful and stated understanding.

## 2016-10-06 ENCOUNTER — Ambulatory Visit: Payer: Self-pay | Attending: Family Medicine

## 2017-01-17 ENCOUNTER — Ambulatory Visit: Payer: Self-pay | Attending: Family Medicine

## 2017-03-16 ENCOUNTER — Ambulatory Visit: Payer: Self-pay | Attending: Family Medicine

## 2017-05-04 ENCOUNTER — Ambulatory Visit: Payer: Self-pay | Attending: Family Medicine

## 2017-08-08 ENCOUNTER — Ambulatory Visit: Payer: Self-pay | Attending: Family Medicine

## 2017-09-22 ENCOUNTER — Encounter: Payer: Self-pay | Admitting: Family Medicine

## 2017-09-22 ENCOUNTER — Ambulatory Visit: Payer: Self-pay | Attending: Family Medicine | Admitting: Family Medicine

## 2017-09-22 VITALS — BP 131/73 | HR 79 | Temp 98.5°F | Wt 217.0 lb

## 2017-09-22 DIAGNOSIS — Z0000 Encounter for general adult medical examination without abnormal findings: Secondary | ICD-10-CM

## 2017-09-22 DIAGNOSIS — R070 Pain in throat: Secondary | ICD-10-CM | POA: Insufficient documentation

## 2017-09-22 DIAGNOSIS — Z13228 Encounter for screening for other metabolic disorders: Secondary | ICD-10-CM | POA: Insufficient documentation

## 2017-09-22 DIAGNOSIS — Z0001 Encounter for general adult medical examination with abnormal findings: Secondary | ICD-10-CM | POA: Insufficient documentation

## 2017-09-22 DIAGNOSIS — B351 Tinea unguium: Secondary | ICD-10-CM | POA: Insufficient documentation

## 2017-09-22 MED ORDER — CETIRIZINE HCL 10 MG PO TABS: 10.0000 mg | ORAL_TABLET | Freq: Every day | ORAL | 1 refills | Status: DC

## 2017-09-22 MED ORDER — TERBINAFINE HCL 250 MG PO TABS: 250.0000 mg | ORAL_TABLET | Freq: Every day | ORAL | 1 refills | Status: DC

## 2017-09-22 NOTE — Patient Instructions (Signed)
Mantenimiento de la salud en los hombres (Health Maintenance, Male) Un estilo de vida saludable y los cuidados preventivos son importantes para la salud y el bienestar. Pregntele al mdico cul es el cronograma de exmenes peridicos adecuado para usted. QU DEBO SABER SOBRE EL PESO Y LA DIETA? Consuma una dieta saludable  Coma muchas verduras, frutas, cereales integrales, productos lcteos con bajo contenido de grasa y protenas magras.  No consuma muchos alimentos de alto contenido de grasas slidas, azcares agregados o sal. Mantenga un peso saludable La actividad fsica habitual puede ayudarlo a alcanzar o mantener un peso saludable. Deber hacer lo siguiente:  Realizar al menos 150minutos de actividad fsica por semana. El ejercicio debe aumentar la frecuencia cardaca y provocar la transpiracin (ejercicio de intensidad moderada).  Hacer ejercicios de entrenamiento de fuerza por lo menos dos veces por semana. Controlarse los niveles de colesterol y lpidos en la sangre  Hgase anlisis de sangre para controlar los lpidos y el colesterol cada 5aos a partir de los 35aos. Si tiene un riesgo alto de tener cardiopatas coronarias, debe comenzar a hacerse anlisis de sangre a los 20aos. Es posible que necesite controlar los niveles de colesterol con mayor frecuencia si: ? Sus niveles de lpidos y colesterol son altos. ? Es mayor de 50aos. ? Tiene un riesgo alto de tener cardiopatas coronarias. QU DEBO SABER SOBRE LAS PRUEBAS DE DETECCIN DEL CNCER? Muchos tipos de cncer se pueden detectar de manera temprana y a menudo prevenirse. Cncer de pulmn  Debe someterse a pruebas de deteccin de cncer de pulmn todos los aos en los siguientes casos: ? Si fuma actualmente y lo ha hecho durante por lo menos 30aos. ? Si fue fumador que dej el hbito en el trmino de los ltimos 15aos.  Hable con el mdico sobre las opciones en relacin con los estudios de deteccin, cundo  debe comenzar a hacrselos y con qu frecuencia. Cncer colorrectal  Generalmente, las pruebas de deteccin habituales del cncer colorrectal comienzan a los 50aos y deben repetirse cada 5 a 10aos hasta los 75aos. Es posible que tenga que hacerse las pruebas con mayor frecuencia si se detectan formas tempranas de plipos precancerosos o pequeos bultos. Sin embargo, el mdico podr aconsejarle que lo haga antes, si tiene factores de riesgo para el cncer de colon.  El mdico puede recomendarle que use kits de prueba caseros para hallar sangre oculta en la materia fecal.  Se puede usar una pequea cmara en el extremo de un tubo para examinar el colon (sigmoidoscopia o colonoscopia). Este estudio detecta las formas ms tempranas de cncer colorrectal. Cncer de prstata y de testculo  En funcin de la edad y del estado de salud general, el mdico puede realizarle determinados estudios de deteccin del cncer de prstata y de testculo.  Hable con el mdico sobre cualquier sntoma o acerca de las inquietudes que tenga sobre el cncer de prstata o de testculo. Cncer de piel  Revise la piel de la cabeza a los pies con regularidad.  Informe al mdico si aparecen nuevos lunares o si nota cambios en los que ya tiene, especialmente en estos casos: ? Si hay un cambio en el tamao, la forma o el color del lunar. ? Si tiene un lunar que es ms grande que el tamao de una goma de lpiz.  Siempre use pantalla solar. Aplquese pantalla solar de manera generosa y repetida a lo largo del da.  Use mangas y pantalones largos, un sombrero de ala ancha y gafas para   el sol cuando est al aire libre, para protegerse. QU DEBO SABER SOBRE LAS CARDIOPATAS CORONARIAS, LA DIABETES Y LA HIPERTENSIN ARTERIAL?  Si usted tiene entre 18 y 39aos, debe medirse la presin arterial cada 3a 5aos. Si usted tiene 40aos o ms, debe medirse la presin arterial todos los aos. Debe medirse la presin arterial  dos veces: una vez cuando est en un hospital o una clnica y la otra vez cuando est en otro sitio. Registre el promedio de las dos mediciones. Para controlar su presin arterial cuando no est en un hospital o una clnica, puede usar lo siguiente: ? Una mquina automtica para medir la presin arterial en una farmacia. ? Un monitor para medir la presin arterial en el hogar.  Hable con el mdico sobre los valores ideales de la presin arterial.  Si tiene entre 45 y 79aos, consltele al mdico si debe tomar aspirina para evitar las cardiopatas coronarias.  Hgase anlisis habituales de deteccin de la diabetes; para ello, contrlese la glucemia en ayunas. ? Si su peso es normal y tiene un bajo riesgo de padecer diabetes, realcese este anlisis cada tres aos despus de los 45aos. ? Si tiene sobrepeso y un alto riesgo de padecer diabetes, considere someterse a este anlisis antes o con mayor frecuencia.  Para los hombres que tienen entre 65 y 75aos, y son o han sido fumadores, se recomienda un nico estudio con ecografa para detectar un aneurisma de aorta abdominal (AAA). QU DEBO SABER SOBRE LA PREVENCIN DE LAS INFECCIONES? HepatitisB Si tiene un riesgo ms alto de contraer hepatitis B, debe someterse a un examen de deteccin de este virus. Hable con el mdico para determinar si corre riesgo de tener una infeccin por hepatitisB. Hepatitis C Se recomienda un anlisis de sangre para:  Todos los que nacieron entre 1945 y 1965.  Todas las personas que tengan un riesgo de haber contrado hepatitis C. Enfermedades de transmisin sexual (ETS)  Debe realizarse pruebas de deteccin de las ETS todos los aos, incluidas la gonorrea y la clamidia, en estos casos: ? Es sexualmente activo y es menor de 24aos. ? Es mayor de 24aos, y el mdico le informa que corre riesgo de tener este tipo de infecciones. ? La actividad sexual ha cambiado desde que le hicieron la ltima prueba de  deteccin y tiene un riesgo mayor de tener clamidia o gonorrea. Pregntele al mdico si usted tiene riesgo.  Consulte a su mdico para saber si tiene un alto riesgo de infectarse por el VIH. El mdico puede recomendarle un medicamento de venta con receta para ayudar a evitar la infeccin por el VIH. QU MS PUEDO HACER?  Realcese los estudios de rutina de la salud, dentales y de la vista.  Mantngase al da con las vacunas (inmunizaciones).  No consuma ningn producto que contenga tabaco, lo que incluye cigarrillos, tabaco de mascar y cigarrillos electrnicos. Si necesita ayuda para dejar de fumar, consulte al mdico.  Limite el consumo de alcohol a no ms de 2medidas por da. Una medida equivale a 12 onzas de cerveza, 5onzas de vino o 1onzas de bebidas alcohlicas de alta graduacin.  No consuma drogas.  No comparta agujas.  Solicite ayuda a su mdico si necesita apoyo o informacin para abandonar las drogas.  Informe a su mdico si a menudo se siente deprimido.  Notifique a su mdico si alguna vez ha sido vctima de abuso o si no se siente seguro en su hogar. Esta informacin no tiene como fin reemplazar el   consejo del mdico. Asegrese de hacerle al mdico cualquier pregunta que tenga. Document Released: 01/01/2008 Document Revised: 07/26/2014 Document Reviewed: 04/08/2015 Elsevier Interactive Patient Education  2018 Elsevier Inc.  

## 2017-09-22 NOTE — Progress Notes (Signed)
Subjective:  Patient ID: Seth Harris, male    DOB: 1976/01/06  Age: 42 y.o. MRN: [id number]  CC: Annual Exam and Cough   HPI Seth Harris 42 year old male who presents for complete physical exam today. He complains of "throat pain"and associated cough which is worse at night and he has to clear his throat frequently.  Denies sinus congestion or sinus pain and has had no fevers. Also complains of darkening of some of his toenails.   Past Medical History:  Diagnosis Date  . Allergy     Past Surgical History:  Procedure Laterality Date  . HERNIA REPAIR      No Known Allergies   Outpatient Medications Prior to Visit  Medication Sig Dispense Refill  . naproxen (NAPROSYN) 500 MG tablet Take 1 tablet (500 mg total) by mouth 2 (two) times daily with a meal. (Patient not taking: Reported on 09/22/2017) 30 tablet 2  . omeprazole (PRILOSEC) 20 MG capsule Take 1 capsule (20 mg total) by mouth daily. (Patient not taking: Reported on 09/22/2017) 30 capsule 3   No facility-administered medications prior to visit.     ROS Review of Systems  Constitutional: Negative for activity change and appetite change.  HENT: Positive for sore throat. Negative for sinus pressure.   Eyes: Negative for visual disturbance.  Respiratory: Positive for cough. Negative for chest tightness and shortness of breath.   Cardiovascular: Negative for chest pain and leg swelling.  Gastrointestinal: Negative for abdominal distention, abdominal pain, constipation and diarrhea.  Endocrine: Negative.   Genitourinary: Negative for dysuria.  Musculoskeletal: Negative for joint swelling and myalgias.  Skin: Negative for rash.  Allergic/Immunologic: Negative.   Neurological: Negative for weakness, light-headedness and numbness.  Psychiatric/Behavioral: Negative for dysphoric mood and suicidal ideas.    Objective:  BP 131/73   Pulse 79   Temp 98.5 F (36.9 C) (Oral)   Wt 217 lb (98.4 kg)   SpO2 98%   BMI 28.63  kg/m   BP/Weight 09/22/2017 08/26/2016 6/38/4665  Systolic BP 993 570 177  Diastolic BP 73 70 73  Wt. (Lbs) 217 223.6 229  BMI 28.63 29.5 31.06      Physical Exam  Constitutional: He is oriented to person, place, and time. He appears well-developed and well-nourished.  HENT:  Head: Normocephalic and atraumatic.  Right Ear: External ear normal.  Left Ear: External ear normal.  Eyes: Conjunctivae and EOM are normal. Pupils are equal, round, and reactive to light.  Neck: Normal range of motion. Neck supple. No tracheal deviation present.  Cardiovascular: Normal rate, regular rhythm and normal heart sounds.  No murmur heard. Pulmonary/Chest: Effort normal and breath sounds normal. No respiratory distress. He has no wheezes. He exhibits no tenderness.  Abdominal: Soft. Bowel sounds are normal. He exhibits no mass. There is no tenderness. Hernia confirmed negative in the right inguinal area and confirmed negative in the left inguinal area.  Genitourinary: Testes normal and penis normal.  Musculoskeletal: Normal range of motion. He exhibits no edema or tenderness.  Neurological: He is alert and oriented to person, place, and time.  Skin: Skin is warm and dry.  Early onychomycosis of right big toenail and the left fourth and fifth toenails  Psychiatric: He has a normal mood and affect.     Assessment & Plan:   1. Annual physical exam Counseled on 150 minutes of exercise per week, healthy eating (including decreased daily intake of saturated fats, cholesterol, added sugars, sodium), STI prevention, routine healthcare maintenance.   2.  Screening for metabolic disorder - EGB15+VVOH - Lipid panel - CBC with Differential/Platelet  3. Throat pain Placed on Zyrtec for possible postnasal drip  4. Onychomycosis Placed on terbinafine   Meds ordered this encounter  Medications  . cetirizine (ZYRTEC) 10 MG tablet    Sig: Take 1 tablet (10 mg total) by mouth daily.    Dispense:  30  tablet    Refill:  1  . terbinafine (LAMISIL) 250 MG tablet    Sig: Take 1 tablet (250 mg total) by mouth daily.    Dispense:  30 tablet    Refill:  1    Follow-up: Return in about 12 months (around 09/23/2018) for complete physical.   Seth Rakes MD

## 2017-09-23 LAB — LIPID PANEL
CHOLESTEROL TOTAL: 127 mg/dL (ref 100–199)
Chol/HDL Ratio: 3.6 ratio (ref 0.0–5.0)
HDL: 35 mg/dL — ABNORMAL LOW (ref >39)
LDL Calculated: 66 mg/dL (ref 0–99)
TRIGLYCERIDES: 129 mg/dL (ref 0–149)
VLDL Cholesterol Cal: 26 mg/dL (ref 5–40)

## 2017-09-23 LAB — CMP14+EGFR
A/G RATIO: 1.4 (ref 1.2–2.2)
ALK PHOS: 66 IU/L (ref 39–117)
ALT: 37 IU/L (ref 0–44)
AST: 23 IU/L (ref 0–40)
Albumin: 4.4 g/dL (ref 3.5–5.5)
BUN/Creatinine Ratio: 10 (ref 9–20)
BUN: 10 mg/dL (ref 6–24)
Bilirubin Total: 0.6 mg/dL (ref 0.0–1.2)
CO2: 23 mmol/L (ref 20–29)
Calcium: 9.4 mg/dL (ref 8.7–10.2)
Chloride: 101 mmol/L (ref 96–106)
Creatinine, Ser: 0.98 mg/dL (ref 0.76–1.27)
GFR calc Af Amer: 108 mL/min/{1.73_m2} (ref >59)
GFR calc non Af Amer: 93 mL/min/{1.73_m2} (ref >59)
GLOBULIN, TOTAL: 3.2 g/dL (ref 1.5–4.5)
Glucose: 99 mg/dL (ref 65–99)
POTASSIUM: 4.4 mmol/L (ref 3.5–5.2)
SODIUM: 143 mmol/L (ref 134–144)
Total Protein: 7.6 g/dL (ref 6.0–8.5)

## 2017-09-23 LAB — CBC WITH DIFFERENTIAL/PLATELET
BASOS: 0 %
Basophils Absolute: 0 10*3/uL (ref 0.0–0.2)
EOS (ABSOLUTE): 0.1 10*3/uL (ref 0.0–0.4)
EOS: 1 %
HEMATOCRIT: 47.2 % (ref 37.5–51.0)
HEMOGLOBIN: 15.7 g/dL (ref 13.0–17.7)
IMMATURE GRANS (ABS): 0 10*3/uL (ref 0.0–0.1)
Immature Granulocytes: 0 %
LYMPHS: 25 %
Lymphocytes Absolute: 1.9 10*3/uL (ref 0.7–3.1)
MCH: 29.8 pg (ref 26.6–33.0)
MCHC: 33.3 g/dL (ref 31.5–35.7)
MCV: 90 fL (ref 79–97)
MONOCYTES: 7 %
Monocytes Absolute: 0.5 10*3/uL (ref 0.1–0.9)
NEUTROS ABS: 5 10*3/uL (ref 1.4–7.0)
Neutrophils: 67 %
Platelets: 222 10*3/uL (ref 150–379)
RBC: 5.27 x10E6/uL (ref 4.14–5.80)
RDW: 13.3 % (ref 12.3–15.4)
WBC: 7.5 10*3/uL (ref 3.4–10.8)

## 2017-09-30 ENCOUNTER — Ambulatory Visit: Payer: Self-pay | Attending: Family Medicine

## 2017-09-30 ENCOUNTER — Telehealth: Payer: Self-pay | Admitting: Family Medicine

## 2017-09-30 NOTE — Telephone Encounter (Signed)
Pt came to the office to request lab result since he received a call

## 2017-10-05 ENCOUNTER — Ambulatory Visit (HOSPITAL_COMMUNITY)
Admission: EM | Admit: 2017-10-05 | Discharge: 2017-10-05 | Disposition: A | Discharge status: Home / Self Care | Payer: Self-pay | Attending: Family Medicine | Admitting: Family Medicine

## 2017-10-05 ENCOUNTER — Encounter (HOSPITAL_COMMUNITY): Payer: Self-pay | Admitting: Emergency Medicine

## 2017-10-05 ENCOUNTER — Ambulatory Visit (INDEPENDENT_AMBULATORY_CARE_PROVIDER_SITE_OTHER): Payer: Self-pay

## 2017-10-05 DIAGNOSIS — R05 Cough: Secondary | ICD-10-CM

## 2017-10-05 DIAGNOSIS — R059 Cough, unspecified: Secondary | ICD-10-CM

## 2017-10-05 HISTORY — DX: Other allergy status, other than to drugs and biological substances: Z91.09

## 2017-10-05 MED ORDER — PREDNISONE 20 MG PO TABS: 40.0000 mg | ORAL_TABLET | Freq: Every day | ORAL | 0 refills | Status: AC

## 2017-10-05 MED ORDER — AZITHROMYCIN 250 MG PO TABS: 250.0000 mg | ORAL_TABLET | Freq: Every day | ORAL | 0 refills | Status: DC

## 2017-10-05 MED ORDER — IPRATROPIUM BROMIDE 0.06 % NA SOLN: 2.0000 | Freq: Four times a day (QID) | NASAL | 0 refills | Status: DC

## 2017-10-05 MED ORDER — FLUTICASONE PROPIONATE 50 MCG/ACT NA SUSP: 2.0000 | Freq: Every day | NASAL | 0 refills | Status: DC

## 2017-10-05 NOTE — ED Notes (Signed)
Utilized [employer] services  For discharge instructions.  Seth Harris [phone].

## 2017-10-05 NOTE — ED Triage Notes (Signed)
C/o "mucus in throat", sore throat and cough

## 2017-10-05 NOTE — ED Provider Notes (Signed)
[facility]    CSN: [id number] Arrival date & time: 10/05/17  1649     History   Chief Complaint Chief Complaint  Patient presents with  . Cough    HPI Seth ForehandMarco Harris is a 42 y.o. male.   42 year old male comes in for 5728-month history of URI symptoms.  Video translator used to obtain HPI.  Patient states had flulike symptoms 2 months ago that lasted about 4-5 days.  Symptoms improved, but with residual productive cough, sore throat, rhinorrhea that has not resolved for the past 2 months.  Has had some blood-tinged phlegm.  Denies fever, chills, night sweats.  States saw her PCP recently, and was given Zyrtec for the symptoms, stopped using it as it did not improve symptoms.  Former smoker.      Past Medical History:  Diagnosis Date  . Allergy   . Environmental allergies     Patient Active Problem List   Diagnosis Date Noted  . Bilateral knee pain 08/12/2016  . Bilateral inguinal hernia without obstruction or gangrene 05/22/2014  . History of inguinal hernia repair 05/06/2014  . Dyspepsia 03/27/2014  . Acid reflux 12/31/2013  . Inguinal pain 12/21/2013  . Back strain 12/21/2013  . RUQ pain 12/21/2013    Past Surgical History:  Procedure Laterality Date  . HERNIA REPAIR         Home Medications    Prior to Admission medications   Medication Sig Start Date End Date Taking? Authorizing Provider  azithromycin (ZITHROMAX) 250 MG tablet Take 1 tablet (250 mg total) by mouth daily. Take first 2 tablets together, then 1 every day until finished. 10/05/17   Cathie HoopsYu, Amy V, PA-C  cetirizine (ZYRTEC) 10 MG tablet Take 1 tablet (10 mg total) by mouth daily. 09/22/17   Hoy RegisterNewlin, Enobong, MD  fluticasone (FLONASE) 50 MCG/ACT nasal spray Place 2 sprays into both nostrils daily. 10/05/17   Cathie HoopsYu, Amy V, PA-C  ipratropium (ATROVENT) 0.06 % nasal spray Place 2 sprays into both nostrils 4 (four) times daily. 10/05/17   Cathie HoopsYu, Amy V, PA-C  naproxen (NAPROSYN) 500 MG tablet Take 1 tablet  (500 mg total) by mouth 2 (two) times daily with a meal. Patient not taking: Reported on 09/22/2017 08/12/16   Hoy RegisterNewlin, Enobong, MD  omeprazole (PRILOSEC) 20 MG capsule Take 1 capsule (20 mg total) by mouth daily. Patient not taking: Reported on 09/22/2017 08/12/16   Hoy RegisterNewlin, Enobong, MD  predniSONE (DELTASONE) 20 MG tablet Take 2 tablets (40 mg total) by mouth daily for 4 days. 10/05/17 10/09/17  Cathie HoopsYu, Amy V, PA-C  terbinafine (LAMISIL) 250 MG tablet Take 1 tablet (250 mg total) by mouth daily. 09/22/17   Hoy RegisterNewlin, Enobong, MD    Family History Family History  Problem Relation Age of Onset  . Diabetes Mother   . Diabetes Brother     Social History Social History   Tobacco Use  . Smoking status: Former Smoker    Types: Cigarettes    Last attempt to quit: 11/09/2001    Years since quitting: 15.9  . Smokeless tobacco: Never Used  Substance Use Topics  . Alcohol use: No  . Drug use: No     Allergies   Patient has no known allergies.   Review of Systems Review of Systems  Reason unable to perform ROS: See HPI as above.     Physical Exam Triage Vital Signs ED Triage Vitals  Enc Vitals Group     BP 10/05/17 1819 130/88  Pulse Rate 10/05/17 1819 75     Resp --      Temp 10/05/17 1819 98.5 F (36.9 C)     Temp Source 10/05/17 1819 Oral     SpO2 10/05/17 1819 100 %     Weight --      Height --      Head Circumference --      Peak Flow --      Pain Score 10/05/17 1820 5     Pain Loc --      Pain Edu? --      Excl. in GC? --    No data found.  Updated Vital Signs BP 130/88 (BP Location: Left Arm)   Pulse 75   Temp 98.5 F (36.9 C) (Oral)   SpO2 100%   Physical Exam  Constitutional: He is oriented to person, place, and time. He appears well-developed and well-nourished. No distress.  HENT:  Head: Normocephalic and atraumatic.  Right Ear: Tympanic membrane, external ear and ear canal normal. Tympanic membrane is not erythematous and not bulging.  Left Ear: Tympanic  membrane, external ear and ear canal normal. Tympanic membrane is not erythematous and not bulging.  Nose: Rhinorrhea present. Right sinus exhibits no maxillary sinus tenderness and no frontal sinus tenderness. Left sinus exhibits no maxillary sinus tenderness and no frontal sinus tenderness.  Mouth/Throat: Uvula is midline, oropharynx is clear and moist and mucous membranes are normal.  Eyes: Conjunctivae are normal. Pupils are equal, round, and reactive to light.  Neck: Normal range of motion. Neck supple.  Cardiovascular: Normal rate, regular rhythm and normal heart sounds. Exam reveals no gallop and no friction rub.  No murmur heard. Pulmonary/Chest: Effort normal and breath sounds normal. He has no decreased breath sounds. He has no wheezes. He has no rhonchi. He has no rales.  Lymphadenopathy:    He has no cervical adenopathy.  Neurological: He is alert and oriented to person, place, and time.  Skin: Skin is warm and dry.  Psychiatric: He has a normal mood and affect. His behavior is normal. Judgment normal.     UC Treatments / Results  Labs (all labs ordered are listed, but only abnormal results are displayed) Labs Reviewed - No data to display  EKG  EKG Interpretation None       Radiology Dg Chest 2 View  Result Date: 10/05/2017 CLINICAL DATA:  Sore throat with green sputum and cough EXAM: CHEST - 2 VIEW COMPARISON:  None. FINDINGS: Mildly diminished lung volumes. No consolidation or effusion. Borderline cardiomegaly. No pneumothorax. Nodular opacity at the right base favors vascular shadow. IMPRESSION: 1. Low lung volumes without acute pulmonary infiltrate or edema 2. Nodular opacity in the right base, favored to represent vascular shadow; suggest short interval two-view radiographic follow-up. Electronically Signed   By: Jasmine Pang M.D.   On: 10/05/2017 19:13    Procedures Procedures (including critical care time)  Medications Ordered in UC Medications - No data to  display   Initial Impression / Assessment and Plan / UC Course  I have reviewed the triage vital signs and the nursing notes.  Pertinent labs & imaging results that were available during my care of the patient were reviewed by me and considered in my medical decision making (see chart for details).    Discussed xray results with patient. Will treat for bronchitis with azithromycin and prednisone. Other symptomatic treatment discussed. Push fluids. Follow up with PCP in 1 week for recheck. Return precautions given.  Copy of xray results provided for patient.   Final Clinical Impressions(s) / UC Diagnoses   Final diagnoses:  Cough    ED Discharge Orders        Ordered    fluticasone (FLONASE) 50 MCG/ACT nasal spray  Daily     10/05/17 1926    ipratropium (ATROVENT) 0.06 % nasal spray  4 times daily     10/05/17 1926    predniSONE (DELTASONE) 20 MG tablet  Daily     10/05/17 1926    azithromycin (ZITHROMAX) 250 MG tablet  Daily     10/05/17 1926        Belinda Fisher, PA-C 10/05/17 1957

## 2017-10-05 NOTE — Discharge Instructions (Signed)
X-ray showed some irregularity that the radiologist thinks could be due to your blood vessels.  But he suggests follow-up with chest x-ray with your primary care provider.  Given 2067-month history of cough, will start you on azithromycin for possible bronchitis.  Prednisone as directed. Start flonase, atrovent nasal spray, zyrtec-D for nasal congestion/drainage. You can use over the counter nasal saline rinse such as neti pot for nasal congestion. Keep hydrated, your urine should be clear to pale yellow in color. Tylenol/motrin for fever and pain. Monitor for any worsening of symptoms, chest pain, shortness of breath, wheezing, swelling of the throat, follow up for reevaluation. Follow up with PCP in 1 week for recheck.   For sore throat try using a honey-based tea. Use 3 teaspoons of honey with juice squeezed from half lemon. Place shaved pieces of ginger into 1/2-1 cup of water and warm over stove top. Then mix the ingredients and repeat every 4 hours as needed.

## 2017-10-06 NOTE — Telephone Encounter (Signed)
Can you call this patient for me and let them know that there labs are all normal.

## 2017-10-06 NOTE — Telephone Encounter (Signed)
CMA call patient regarding lab results   Patient Verify DOB   Patient was aware and understood  

## 2017-10-07 ENCOUNTER — Encounter: Payer: Self-pay | Admitting: Family Medicine

## 2017-10-07 ENCOUNTER — Ambulatory Visit: Payer: Self-pay | Attending: Family Medicine | Admitting: Family Medicine

## 2017-10-07 VITALS — BP 128/77 | HR 67 | Temp 98.2°F | Wt 221.0 lb

## 2017-10-07 DIAGNOSIS — K089 Disorder of teeth and supporting structures, unspecified: Secondary | ICD-10-CM

## 2017-10-07 DIAGNOSIS — Z79899 Other long term (current) drug therapy: Secondary | ICD-10-CM | POA: Insufficient documentation

## 2017-10-07 DIAGNOSIS — J40 Bronchitis, not specified as acute or chronic: Secondary | ICD-10-CM | POA: Insufficient documentation

## 2017-10-07 DIAGNOSIS — R0982 Postnasal drip: Secondary | ICD-10-CM | POA: Insufficient documentation

## 2017-10-07 DIAGNOSIS — G8929 Other chronic pain: Secondary | ICD-10-CM | POA: Insufficient documentation

## 2017-10-07 DIAGNOSIS — R9389 Abnormal findings on diagnostic imaging of other specified body structures: Secondary | ICD-10-CM

## 2017-10-07 NOTE — Patient Instructions (Signed)
Porfavor puede ir a An[employer] Pet[employer] Corporationhacerse los rayos X de su pecho en 3 semanas

## 2017-10-07 NOTE — Progress Notes (Signed)
Subjective:  Patient ID: Seth Harris, male    DOB: 27-Nov-1975  Age: 42 y.o. MRN: [id number]  CC: Cough   HPI Seth Harris is a 42 year old male who was seen at [facility] urgent care center 2 days ago for bronchitis and placed on azithromycin and prednisone which he recently started.  He had presented with persisting cough not responding to Zyrtec. Chest x-ray performed:  IMPRESSION: 1. Low lung volumes without acute pulmonary infiltrate or edema 2. Nodular opacity in the right base, favored to represent vascular shadow; suggest short interval two-view radiographic follow-up.  He complains of persisting cough and postnasal drip which starts out as a tickle in his throat and is followed by a cough and he has an associated feeling of phlegm in his throat.  Denies fever or sinus congestion. He would like a referral to a dentist for pain in his left upper molars.  Past Medical History:  Diagnosis Date  . Allergy   . Environmental allergies     Past Surgical History:  Procedure Laterality Date  . HERNIA REPAIR      No Known Allergies   Outpatient Medications Prior to Visit  Medication Sig Dispense Refill  . azithromycin (ZITHROMAX) 250 MG tablet Take 1 tablet (250 mg total) by mouth daily. Take first 2 tablets together, then 1 every day until finished. 6 tablet 0  . cetirizine (ZYRTEC) 10 MG tablet Take 1 tablet (10 mg total) by mouth daily. 30 tablet 1  . fluticasone (FLONASE) 50 MCG/ACT nasal spray Place 2 sprays into both nostrils daily. 1 g 0  . ipratropium (ATROVENT) 0.06 % nasal spray Place 2 sprays into both nostrils 4 (four) times daily. 15 mL 0  . predniSONE (DELTASONE) 20 MG tablet Take 2 tablets (40 mg total) by mouth daily for 4 days. 8 tablet 0  . terbinafine (LAMISIL) 250 MG tablet Take 1 tablet (250 mg total) by mouth daily. 30 tablet 1  . naproxen (NAPROSYN) 500 MG tablet Take 1 tablet (500 mg total) by mouth 2 (two) times daily with a meal. (Patient not  taking: Reported on 09/22/2017) 30 tablet 2  . omeprazole (PRILOSEC) 20 MG capsule Take 1 capsule (20 mg total) by mouth daily. (Patient not taking: Reported on 09/22/2017) 30 capsule 3   No facility-administered medications prior to visit.     ROS Review of Systems  Constitutional: Negative for activity change and appetite change.  HENT: Positive for postnasal drip. Negative for sinus pressure and sore throat.   Eyes: Negative for visual disturbance.  Respiratory: Positive for cough. Negative for chest tightness and shortness of breath.   Cardiovascular: Negative for chest pain and leg swelling.  Gastrointestinal: Negative for abdominal distention, abdominal pain, constipation and diarrhea.  Endocrine: Negative.   Genitourinary: Negative for dysuria.  Musculoskeletal: Negative for joint swelling and myalgias.  Skin: Negative for rash.  Allergic/Immunologic: Negative.   Neurological: Negative for weakness, light-headedness and numbness.  Psychiatric/Behavioral: Negative for dysphoric mood and suicidal ideas.    Objective:  BP 128/77   Pulse 67   Temp 98.2 F (36.8 C) (Oral)   Wt 221 lb (100.2 kg)   SpO2 99%   BMI 29.16 kg/m   BP/Weight 10/07/2017 10/05/2017 09/22/2017  Systolic BP 128 130 131  Diastolic BP 77 88 73  Wt. (Lbs) 221 - 217  BMI 29.16 - 28.63     Physical Exam  Constitutional: He is oriented to person, place, and time. He appears well-developed and well-nourished.  HENT:  Mouth/Throat: Oropharynx is clear and moist.  Cardiovascular: Normal rate, normal heart sounds and intact distal pulses.  No murmur heard. Pulmonary/Chest: Effort normal and breath sounds normal. He has no wheezes. He has no rales. He exhibits no tenderness.  Abdominal: Soft. Bowel sounds are normal. He exhibits no distension and no mass. There is no tenderness.  Musculoskeletal: Normal range of motion.  Neurological: He is alert and oriented to person, place, and time.     Assessment &  Plan:   1. Bronchitis Advised to complete course of azithromycin and prednisone  2. Post-nasal drip Previously prescribed Zyrtec which she has been advised to take  3. Abnormal x-ray Nodular opacity in the right base favored to represent vascular shadow Follow-up imaging ordered - DG Chest 2 View; Future  4. Chronic dental pain - Ambulatory referral to Dentistry   No orders of the defined types were placed in this encounter.   Follow-up: Return for follow up keep previously scheduled appointment.   Hoy RegisterEnobong Muad Noga MD

## 2017-10-31 ENCOUNTER — Ambulatory Visit (HOSPITAL_COMMUNITY)
Admission: RE | Admit: 2017-10-31 | Discharge: 2017-10-31 | Disposition: A | Discharge status: Home / Self Care | Payer: Self-pay | Source: Ambulatory Visit | Attending: Family Medicine | Admitting: Family Medicine

## 2017-10-31 DIAGNOSIS — R918 Other nonspecific abnormal finding of lung field: Secondary | ICD-10-CM | POA: Insufficient documentation

## 2017-10-31 DIAGNOSIS — R9389 Abnormal findings on diagnostic imaging of other specified body structures: Secondary | ICD-10-CM | POA: Insufficient documentation

## 2017-11-01 ENCOUNTER — Other Ambulatory Visit: Payer: Self-pay | Admitting: Family Medicine

## 2017-11-01 DIAGNOSIS — R911 Solitary pulmonary nodule: Secondary | ICD-10-CM

## 2017-11-07 ENCOUNTER — Telehealth: Payer: Self-pay

## 2017-11-07 NOTE — Telephone Encounter (Signed)
Patient was called and informed of x-ray results. Patient will be called with CT scan appointment.

## 2017-11-07 NOTE — Telephone Encounter (Signed)
CMA call regarding CT appointment on 11/15/17 @ 4:30 pm [facility]   Patient did not answer but left a detailed message regarding his appt & to call back if have any questions

## 2017-11-11 ENCOUNTER — Ambulatory Visit: Payer: Self-pay | Attending: Family Medicine

## 2017-11-15 ENCOUNTER — Ambulatory Visit (HOSPITAL_COMMUNITY)
Admission: RE | Admit: 2017-11-15 | Discharge: 2017-11-15 | Disposition: A | Discharge status: Home / Self Care | Payer: Self-pay | Source: Ambulatory Visit | Attending: Family Medicine | Admitting: Family Medicine

## 2017-11-15 DIAGNOSIS — R911 Solitary pulmonary nodule: Secondary | ICD-10-CM

## 2017-11-15 DIAGNOSIS — J8410 Pulmonary fibrosis, unspecified: Secondary | ICD-10-CM | POA: Insufficient documentation

## 2017-11-17 ENCOUNTER — Telehealth: Payer: Self-pay

## 2017-11-17 NOTE — Telephone Encounter (Signed)
Patient was called by Seth Harris [phone]) and informed of CT scan results.

## 2018-02-03 ENCOUNTER — Ambulatory Visit: Payer: Self-pay | Attending: Family Medicine

## 2018-03-28 ENCOUNTER — Ambulatory Visit: Payer: Self-pay | Admitting: Family Medicine

## 2018-04-26 ENCOUNTER — Ambulatory Visit: Payer: Self-pay | Attending: Family Medicine

## 2018-05-09 ENCOUNTER — Encounter: Payer: Self-pay | Admitting: Family Medicine

## 2018-05-09 ENCOUNTER — Ambulatory Visit: Payer: Self-pay | Attending: Family Medicine | Admitting: Family Medicine

## 2018-05-09 VITALS — BP 128/78 | HR 72 | Temp 98.5°F | Ht 72.0 in | Wt 229.2 lb

## 2018-05-09 DIAGNOSIS — K219 Gastro-esophageal reflux disease without esophagitis: Secondary | ICD-10-CM | POA: Insufficient documentation

## 2018-05-09 DIAGNOSIS — Z79899 Other long term (current) drug therapy: Secondary | ICD-10-CM | POA: Insufficient documentation

## 2018-05-09 DIAGNOSIS — M25562 Pain in left knee: Secondary | ICD-10-CM | POA: Insufficient documentation

## 2018-05-09 DIAGNOSIS — G8929 Other chronic pain: Secondary | ICD-10-CM | POA: Insufficient documentation

## 2018-05-09 MED ORDER — NAPROXEN 500 MG PO TABS: 500.0000 mg | ORAL_TABLET | Freq: Two times a day (BID) | ORAL | 2 refills | Status: DC

## 2018-05-09 MED ORDER — OMEPRAZOLE 20 MG PO CPDR: 20.0000 mg | DELAYED_RELEASE_CAPSULE | Freq: Every day | ORAL | 6 refills | Status: DC

## 2018-05-09 NOTE — Progress Notes (Signed)
Subjective:  Patient ID: Seth Harris, male    DOB: 10-22-1975  Age: 42 y.o. MRN: [id number]  CC: Abdominal Pain   HPI Seth Harris is a 42 year old male with a history of GERD who presents today complaining of abdominal pain for the last 2 months ever since he ran out of his omeprazole.  He describes epigastric burning which increases with intake of solids and chili.  Denies nausea, vomiting, diarrhea. He also has left knee pain which is a 4/10 at this time and increases when he is on his knees.  He works in the [employer]. Left knee x-ray from 07/2016 revealed no acute abnormality.  Past Medical History:  Diagnosis Date  . Allergy   . Environmental allergies     Past Surgical History:  Procedure Laterality Date  . HERNIA REPAIR      No Known Allergies   Outpatient Medications Prior to Visit  Medication Sig Dispense Refill  . omeprazole (PRILOSEC) 20 MG capsule Take 1 capsule (20 mg total) by mouth daily. 30 capsule 3  . cetirizine (ZYRTEC) 10 MG tablet Take 1 tablet (10 mg total) by mouth daily. (Patient not taking: Reported on 05/09/2018) 30 tablet 1  . fluticasone (FLONASE) 50 MCG/ACT nasal spray Place 2 sprays into both nostrils daily. (Patient not taking: Reported on 05/09/2018) 1 g 0  . ipratropium (ATROVENT) 0.06 % nasal spray Place 2 sprays into both nostrils 4 (four) times daily. (Patient not taking: Reported on 05/09/2018) 15 mL 0  . azithromycin (ZITHROMAX) 250 MG tablet Take 1 tablet (250 mg total) by mouth daily. Take first 2 tablets together, then 1 every day until finished. (Patient not taking: Reported on 05/09/2018) 6 tablet 0  . naproxen (NAPROSYN) 500 MG tablet Take 1 tablet (500 mg total) by mouth 2 (two) times daily with a meal. (Patient not taking: Reported on 09/22/2017) 30 tablet 2  . terbinafine (LAMISIL) 250 MG tablet Take 1 tablet (250 mg total) by mouth daily. (Patient not taking: Reported on 05/09/2018) 30 tablet 1     No facility-administered medications prior to visit.     ROS Review of Systems  Constitutional: Negative for activity change and appetite change.  HENT: Negative for sinus pressure and sore throat.   Eyes: Negative for visual disturbance.  Respiratory: Negative for cough, chest tightness and shortness of breath.   Cardiovascular: Negative for chest pain and leg swelling.  Gastrointestinal: Positive for abdominal pain. Negative for abdominal distention, constipation and diarrhea.  Endocrine: Negative.   Genitourinary: Negative for dysuria.  Musculoskeletal:       See HPI  Skin: Negative for rash.  Allergic/Immunologic: Negative.   Neurological: Negative for weakness, light-headedness and numbness.  Psychiatric/Behavioral: Negative for dysphoric mood and suicidal ideas.    Objective:  BP 128/78   Pulse 72   Temp 98.5 F (36.9 C) (Oral)   Ht 6' (1.829 m)   Wt 229 lb 3.2 oz (104 kg)   SpO2 98%   BMI 31.09 kg/m   BP/Weight 05/09/2018 10/07/2017 10/05/2017  Systolic BP 128 128 130  Diastolic BP 78 77 88  Wt. (Lbs) 229.2 221 -  BMI 31.09 29.16 -      Physical Exam  Constitutional: He is oriented to person, place, and time. He appears well-developed and well-nourished.  Cardiovascular: Normal rate, normal heart sounds and intact distal pulses.  No murmur heard. Pulmonary/Chest: Effort normal and breath sounds normal. He has no wheezes. He has no rales. He  exhibits no tenderness.  Abdominal: Soft. Bowel sounds are normal. He exhibits no distension and no mass. There is no tenderness.  Musculoskeletal:  Normal appearance of both knees No tenderness on palpation of medial lateral joint lines Full range of motion  Neurological: He is alert and oriented to person, place, and time.     Assessment & Plan:   1. Gastroesophageal reflux disease without esophagitis Onset of symptoms ever since he ran out of his PPI which have refilled Discussed dietary modifications to  prevent development of symptoms - omeprazole (PRILOSEC) 20 MG capsule; Take 1 capsule (20 mg total) by mouth daily.  Dispense: 30 capsule; Refill: 6  2. Chronic pain of left knee Likely from his job where he is usually in his knees most times Placed on NSAID Apply ice, use knee brace - naproxen (NAPROSYN) 500 MG tablet; Take 1 tablet (500 mg total) by mouth 2 (two) times daily with a meal.  Dispense: 30 tablet; Refill: 2   Meds ordered this encounter  Medications  . omeprazole (PRILOSEC) 20 MG capsule    Sig: Take 1 capsule (20 mg total) by mouth daily.    Dispense:  30 capsule    Refill:  6  . naproxen (NAPROSYN) 500 MG tablet    Sig: Take 1 tablet (500 mg total) by mouth 2 (two) times daily with a meal.    Dispense:  30 tablet    Refill:  2    Follow-up: Return in about 6 months (around 11/08/2018) for follow up of chronic medical conditions.   Hoy Register MD

## 2018-05-09 NOTE — Patient Instructions (Signed)
Opciones de alimentos para pacientes con reflujo gastroesofágico - Adultos  (Food Choices for Gastroesophageal Reflux Disease, Adult)  Cuando se tiene reflujo gastroesofágico (ERGE), los alimentos que se ingieren y los hábitos de alimentación son muy importantes. Elegir los alimentos adecuados puede ayudar a aliviar las molestias.  ¿QUÉ PAUTAS DEBO SEGUIR?  · Elija las frutas, los vegetales, los cereales integrales y los productos lácteos con bajo contenido de grasa.  · Elija las carnes de vaca, de pescado y de ave con bajo contenido de grasas.  · Limite las grasas, como los aceites, los aderezos para ensalada, la manteca, los frutos secos y el aguacate.  · Lleve un registro de alimentos. Esto ayuda a identificar los alimentos que ocasionan síntomas.  · Evite los alimentos que le ocasionen síntomas. Pueden ser distintos para cada persona.  · Haga comidas pequeñas durante el día en lugar de 3 comidas abundantes.  · Coma lentamente, en un lugar donde esté distendido.  · Limite el consumo de alimentos fritos.  · Cocine los alimentos utilizando métodos que no sean la fritura.  · Evite el consumo alcohol.  · Evite beber grandes cantidades de líquidos con las comidas.  · Evite agacharse o recostarse hasta después de 2 o 3 horas de haber comido.    ¿QUÉ ALIMENTOS NO SE RECOMIENDAN?  Estos son algunos alimentos y bebidas que pueden empeorar los síntomas:  Vegetales  Tomates. Jugo de tomate. Salsa de tomate y espagueti. Ajíes. Cebolla y ajo. Rábano picante.  Frutas  Naranjas, pomelos y limón (fruta y jugo).  Carnes  Carnes de vaca, de pescado y de ave con gran contenido de grasas. Esto incluye los perros calientes, las costillas, el jamón, la salchicha, el salame y el tocino.  Lácteos  Leche entera y leche chocolatada. Crema ácida. Crema. Mantequilla. Helados. Queso crema.  Bebidas  Té o café. Bebidas gaseosas o bebidas energizantes.  Condimentos  Salsa picante. Salsa barbacoa.  Dulces/postres   Chocolate y cacao. Rosquillas. Menta y mentol.  Grasas y aceites  Alimentos muy grasos. Esto incluye las papas fritas.  Otros  Vinagre. Especias picantes. Esto incluye la pimienta negra, la pimienta blanca, la pimienta roja, la pimienta de cayena, el curry en polvo, los clavos de olor, el jengibre y el chile en polvo.  Esta no es una lista completa de los alimentos y las bebidas que se deben evitar. Comuníquese con el nutricionista para recibir más información.  Esta información no tiene como fin reemplazar el consejo del médico. Asegúrese de hacerle al médico cualquier pregunta que tenga.  Document Released: 01/04/2012 Document Revised: 07/26/2014 Document Reviewed: 05/09/2013  Elsevier Interactive Patient Education © 2017 Elsevier Inc.

## 2018-05-09 NOTE — Progress Notes (Signed)
Patient is having burning in stomach. Patient states that he is out of medication.

## 2018-08-24 ENCOUNTER — Ambulatory Visit: Payer: Self-pay | Attending: Family Medicine | Admitting: Family Medicine

## 2018-08-24 ENCOUNTER — Encounter: Payer: Self-pay | Admitting: Family Medicine

## 2018-08-24 ENCOUNTER — Other Ambulatory Visit: Payer: Self-pay

## 2018-08-24 ENCOUNTER — Ambulatory Visit: Payer: Self-pay | Attending: Family Medicine

## 2018-08-24 VITALS — BP 107/71 | HR 102 | Temp 100.5°F | Resp 20 | Ht 72.0 in | Wt 234.0 lb

## 2018-08-24 DIAGNOSIS — R05 Cough: Secondary | ICD-10-CM

## 2018-08-24 DIAGNOSIS — R509 Fever, unspecified: Secondary | ICD-10-CM

## 2018-08-24 DIAGNOSIS — Z20828 Contact with and (suspected) exposure to other viral communicable diseases: Secondary | ICD-10-CM

## 2018-08-24 DIAGNOSIS — R059 Cough, unspecified: Secondary | ICD-10-CM

## 2018-08-24 LAB — POCT INFLUENZA A/B
Influenza A, POC: NEGATIVE
Influenza B, POC: NEGATIVE

## 2018-08-24 MED ORDER — AZITHROMYCIN 250 MG PO TABS: ORAL_TABLET | ORAL | 0 refills | Status: DC

## 2018-08-24 MED ORDER — OSELTAMIVIR PHOSPHATE 75 MG PO CAPS: 75.0000 mg | ORAL_CAPSULE | Freq: Two times a day (BID) | ORAL | 0 refills | Status: AC

## 2018-08-24 NOTE — Patient Instructions (Signed)
Gripe en los adultos  Influenza, Adult  A la gripe tambin se la conoce como "influenza". Es una infeccin en los pulmones, la nariz y la garganta (vas respiratorias). La causa un virus. La gripe provoca sntomas que son similares a los de un resfro. Tambin causa fiebre alta y dolores corporales.  Se transmite fcilmente de persona a persona (es contagiosa). La mejor manera de prevenir la gripe es aplicndose la vacuna contra la gripe todos los aos.  Cules son las causas?  La causa de esta afeccin es el virus de la influenza. Puede contraer el virus de las siguientes maneras:   Respirar las gotitas que estn en el aire y que provienen de la tos o el estornudo de una persona que tiene el virus.   Tocar algo que tiene el virus (est contaminado) y luego tocarse la boca, la nariz o los ojos.  Qu incrementa el riesgo?  Hay ciertas cosas que lo pueden hacer ms propenso a tener gripe. Estas incluyen lo siguiente:   No lavarse las manos con frecuencia.   Tener contacto cercano con muchas personas durante la temporada de resfro y gripe.   Tocarse la boca, los ojos o la nariz sin antes lavarse las manos.   No recibir la vacuna antigripal todos los aos.  Puede correr un mayor riesgo de tener gripe, junto con problemas graves como una infeccin pulmonar (neumona), si:   Es mayor de 65 aos de edad.   Est embarazada.   Tiene debilitado el sistema que combate las defensas (sistema inmunitario) debido a una enfermedad o porque toma determinados medicamentos.   Tiene una enfermedad prolongada (crnica), por ejemplo:  ? Enfermedad cardaca, renal o pulmonar.  ? Diabetes.  ? Asma.   Tiene un trastorno heptico.   Tiene mucho sobrepeso (obesidad mrbida).   Tiene anemia. Esta es una afeccin que afecta a los glbulos rojos.  Cules son los signos o los sntomas?  Los sntomas normalmente comienzan de repente y duran entre 4 y 14 das. Pueden incluir los siguientes:   Fiebre y escalofros.   Dolores de  cabeza, dolores en el cuerpo o dolores musculares.   Dolor de garganta.   Tos.   Secrecin o congestin nasal.   Malestar en el pecho.   No desear comer en las cantidades normales (prdida del apetito).   Debilidad o cansancio (fatiga).   Mareos.   Malestar estomacal (nuseas) o ganas de devolver (vmitos).  Cmo se trata?  Si la gripe se encuentra de forma temprana, se la puede tratar con medicamentos que pueden ayudar a reducir la gravedad de la enfermedad y reducir su duracin (medicamentos antivirales). Estos pueden administrarse por boca (va oral) o por va (catter) intravenosa.  Cuidarse en su hogar puede ayudar a que mejoren los sntomas. El mdico puede sugerirle lo siguiente:   Tomar medicamentos de venta libre.   Beber mucho lquido.  La gripe suele desaparecer sola. Si tiene sntomas muy graves u otros problemas, puede recibir tratamiento en un hospital.  Siga estas indicaciones en su casa:         Actividad   Descanse todo lo que sea necesario. Duerma lo suficiente.   Qudese en su casa y no concurra al trabajo o a la escuela, como se lo haya indicado el mdico.  ? No salga de su casa hasta que no haya tenido fiebre por 24horas sin tomar medicamentos.  ? Salga de su casa solo para ir al mdico.  Comida y bebida   Tome   una SRO (solucin de rehidratacin oral). Es una bebida que se vende en farmacias y tiendas.   Beba suficiente lquido para mantener el pis (la orina) de color amarillo plido.   En la medida en que pueda, beba lquidos claros en pequeas cantidades. Los lquidos transparentes son, por ejemplo:  ? Agua.  ? Trocitos de hielo.  ? Jugo de frutas con agua agregada (jugo de frutas diluido).  ? Bebidas deportivas de bajas caloras.   En la medida en que pueda, consuma alimentos blandos y fciles de digerir en pequeas cantidades. Estos alimentos incluyen:  ? Bananas.  ? Pur de manzana.  ? Arroz.  ? Carnes magras.  ? Tostadas.  ? Galletas.   No coma ni beba lo  siguiente:  ? Lquidos con alto contenido de azcar o cafena.  ? Alcohol.  ? Alimentos condimentados o con alto contenido de grasa.  Indicaciones generales   Tome los medicamentos de venta libre y los recetados solamente como se lo haya indicado el mdico.   Use un humidificador de aire fro para que el aire de su casa est ms hmedo. Esto puede facilitar la respiracin.   Al toser o estornudar, cbrase la boca y la nariz.   Lvese las manos con agua y jabn frecuentemente, en especial despus de toser o estornudar. Use desinfectante para manos con alcohol si no dispone de agua y jabn.   Concurra a todas las visitas de control como se lo haya indicado el mdico. Esto es importante.  Cmo se evita?     Colquese la vacuna antigripal todos los aos. Puede colocarse la vacuna contra la gripe a fines de verano, en otoo o en invierno. Pregntele al mdico cundo debe aplicarse la vacuna contra la gripe.   Evite el contacto con personas que estn enfermas durante el otoo y el invierno (la temporada de resfro y gripe).  Comunquese con un mdico si:   Tiene sntomas nuevos.   Tiene los siguientes sntomas:  ? Dolor en el pecho.  ? Materia fecal lquida (diarrea).  ? Fiebre.   La tos empeora.   Empieza a tener ms mucosidad.   Tiene malestar estomacal.   Vomita.  Solicite ayuda inmediatamente si:   Le falta el aire.   Tiene dificultad para respirar.   La piel o las uas se ponen de un color azulado.   Presenta dolor muy intenso o rigidez en el cuello.   Tiene dolor de cabeza repentino.   Le duele la cara o el odo de forma repentina.   No puede comer ni beber sin vomitar.  Resumen   La gripe es una infeccin en los pulmones, la nariz y la garganta. La causa un virus.   Tome los medicamentos de venta libre y los recetados solamente como se lo haya indicado el mdico.   Aplicarse la vacuna contra la gripe todos los aos es la mejor manera de evitar contagiarse la gripe.  Esta informacin no tiene  como fin reemplazar el consejo del mdico. Asegrese de hacerle al mdico cualquier pregunta que tenga.  Document Released: 10/01/2008 Document Revised: 02/15/2018 Document Reviewed: 02/15/2018  Elsevier Interactive Patient Education  2019 [employer] Inc[employer]

## 2018-08-24 NOTE — Progress Notes (Signed)
Body aches Head aches  Cough Son has was diagnosed with influenza

## 2018-08-24 NOTE — Progress Notes (Signed)
Subjective:    Patient ID: Seth Harris, male    DOB: 10/02/1975, 43 y.o.   MRN: [id number]  HPI       43 yo male seen secondary to exposure to influenza as his son was recently diagnosed with the flu.  Patient reports 2 to 3 days of body aches, nonproductive cough, nasal congestion with postnasal drainage and patient with fever for 2 days.  Patient also with complaint of fatigue.  Patient states that yesterday he was eating some marshmallows and did feel as if his throat was irritated when he was swallowing.  Patient denies any nausea/vomiting or diarrhea.  No rash.  Past Medical History:  Diagnosis Date  . Allergy   . Environmental allergies    Social History   Tobacco Use  . Smoking status: Former Smoker    Types: Cigarettes    Last attempt to quit: 11/09/2001    Years since quitting: 16.8  . Smokeless tobacco: Never Used  Substance Use Topics  . Alcohol use: No  . Drug use: No  No Known Allergies   Review of Systems  Constitutional: Positive for fatigue and fever. Negative for chills and diaphoresis.  HENT: Positive for congestion, postnasal drip, rhinorrhea and sore throat (mild throat irritation with eating). Negative for ear pain, sinus pressure, sinus pain and trouble swallowing.   Respiratory: Positive for cough (nonproductive). Negative for shortness of breath.   Cardiovascular: Negative for chest pain, palpitations and leg swelling.  Gastrointestinal: Negative for abdominal pain, constipation, diarrhea and nausea.  Genitourinary: Negative for dysuria and frequency.  Musculoskeletal: Positive for arthralgias and myalgias. Negative for back pain, gait problem, joint swelling, neck pain and neck stiffness.  Neurological: Positive for dizziness, light-headedness and headaches.  Hematological: Negative for adenopathy. Does not bruise/bleed easily.       Objective:   Physical Exam BP 107/71 (BP Location: Left Arm, Patient Position: Sitting, Cuff Size: Large)   Pulse  (!) 102   Temp (!) 100.5 F (38.1 C) (Oral)   Resp 20   Wt 234 lb (106.1 kg)   SpO2 98%   BMI 31.74 kg/m  Nurse's notes and vital signs reviewed General-well-nourished, well-developed male in no acute distress sitting on exam table wearing a mask ENT- TMs light pink, nares with moderate edema of the nasal turbinates with clear nasal discharge, patient with posterior pharynx edema/erythema with cobblestoning Neck-supple, no lymphadenopathy Cardiovascular-regular rate and regular rhythm Lungs- clear to auscultation bilaterally; with mild decreased breath sounds at the lung bases Abdomen-soft, nontender Back-no CVA tenderness      Assessment & Plan:  1. Cough Patient with cough, fever and body aches as well as exposure to influenza.  Patient had testing for influenza which was negative however as patient with exposure as well as symptoms consistent with influenza infection, patient will be placed on Tamiflu however I did discuss with the patient that he is near the timeframe where the medication may not significantly make a difference in the course of his illness.  If patient develops nausea or vomiting or diarrhea which can be symptoms of Tamiflu, he should stop the use of the medication.  Patient may take over-the-counter medication for cough and congestion.  Prescription for azithromycin in case of influenza related pneumonia. - Influenza A/B - oseltamivir (TAMIFLU) 75 MG capsule; Take 1 capsule (75 mg total) by mouth 2 (two) times daily for 5 days.  Dispense: 10 capsule; Refill: 0 - azithromycin (ZITHROMAX) 250 MG tablet; Take 2 pills today then one  pill daily x 4 days  Dispense: 6 tablet; Refill: 0  2. Exposure to influenza Patient son with influenza and patient now with fever, cough and myalgias consistent with influenza symptoms.  Patient did have testing for influenza but rapid test was negative.  Patient however with symptoms consistent with influenza.  Prescription provided for  Tamiflu and patient encouraged to rest, remain hydrated and can take over-the-counter medications for symptoms. - Influenza A/B - oseltamivir (TAMIFLU) 75 MG capsule; Take 1 capsule (75 mg total) by mouth 2 (two) times daily for 5 days.  Dispense: 10 capsule; Refill: 0  3. Fever, unspecified fever cause Patient with fever at today's visit and exposure to influenza therefore patient was tested for influenza.  Patient's influenza test was negative at today's visit but patient with exposure and symptoms of influenza therefore prescription provided for Tamiflu to help shorten duration/severity of symptoms as well as azithromycin in case of secondary pneumonia related to influenza.  Work note provided to be out of work until Monday as long as he has been afebrile for 24 hours.  Rest and remain well-hydrated and take over-the-counter medications as needed for fever/chills and body aches as well as cough. - oseltamivir (TAMIFLU) 75 MG capsule; Take 1 capsule (75 mg total) by mouth 2 (two) times daily for 5 days.  Dispense: 10 capsule; Refill: 0 - azithromycin (ZITHROMAX) 250 MG tablet; Take 2 pills today then one pill daily x 4 days  Dispense: 6 tablet; Refill: 0  An After Visit Summary was printed and given to the patient.  Return if symptoms worsen or fail to improve.

## 2018-08-28 ENCOUNTER — Ambulatory Visit: Payer: Self-pay

## 2018-12-14 ENCOUNTER — Telehealth: Payer: Self-pay | Admitting: Family Medicine

## 2018-12-14 NOTE — Telephone Encounter (Signed)
I called to inform that his CAFA application was Patient submitted application for financial assistance. Application scanned to onbase [id number] however patient does not currently have any accounts PB or HAR that would qualify for financial assistance, no further action needed as this time.. NO appointments scheduled  \at this time he need to reapply

## 2019-01-01 ENCOUNTER — Ambulatory Visit: Payer: Self-pay | Attending: Family Medicine

## 2019-01-01 ENCOUNTER — Other Ambulatory Visit: Payer: Self-pay

## 2019-01-18 ENCOUNTER — Other Ambulatory Visit: Payer: Self-pay

## 2019-01-18 ENCOUNTER — Encounter: Payer: Self-pay | Admitting: Family Medicine

## 2019-01-18 ENCOUNTER — Other Ambulatory Visit: Payer: Self-pay | Admitting: Family Medicine

## 2019-01-18 ENCOUNTER — Ambulatory Visit: Payer: Self-pay | Attending: Family Medicine | Admitting: Family Medicine

## 2019-01-18 VITALS — BP 136/84 | HR 70 | Temp 98.5°F | Ht 72.0 in | Wt 231.8 lb

## 2019-01-18 DIAGNOSIS — Z13228 Encounter for screening for other metabolic disorders: Secondary | ICD-10-CM

## 2019-01-18 DIAGNOSIS — B309 Viral conjunctivitis, unspecified: Secondary | ICD-10-CM

## 2019-01-18 DIAGNOSIS — N6001 Solitary cyst of right breast: Secondary | ICD-10-CM

## 2019-01-18 MED ORDER — OFLOXACIN 0.3 % OP SOLN: 1.0000 [drp] | Freq: Four times a day (QID) | OPHTHALMIC | 0 refills | Status: DC

## 2019-01-18 NOTE — Progress Notes (Signed)
Patient has redness in his eye. Patient states that he has a lump in his chest that is painful when he touches it.

## 2019-01-18 NOTE — Progress Notes (Signed)
Subjective:  Patient ID: Seth Harris, male    DOB: [birth year]  Age: 43 y.o. MRN: [id number]  CC: Conjunctivitis   HPI Seth Harris is a 43 year old male who presents today with a six-month history of a right wrist mass which is nontender, mobile and has no change in size but whenever he should moves over it he feels some discomfort. He has also had a 5-day history of left eye redness with associated burning, eye crusting in the morning, foreign body sensation but normal vision.  He denies upper respiratory symptoms. He has not used any medications for this.  Past Medical History:  Diagnosis Date  . Allergy   . Environmental allergies     Past Surgical History:  Procedure Laterality Date  . HERNIA REPAIR      Family History  Problem Relation Age of Onset  . Diabetes Mother   . Diabetes Brother     No Known Allergies  Outpatient Medications Prior to Visit  Medication Sig Dispense Refill  . omeprazole (PRILOSEC) 20 MG capsule Take 1 capsule (20 mg total) by mouth daily. 30 capsule 6  . azithromycin (ZITHROMAX) 250 MG tablet Take 2 pills today then one pill daily x 4 days (Patient not taking: Reported on 01/18/2019) 6 tablet 0   No facility-administered medications prior to visit.      ROS Review of Systems  Constitutional: Negative for activity change and appetite change.  HENT: Negative for sinus pressure and sore throat.   Eyes: Negative for visual disturbance.  Respiratory: Negative for cough, chest tightness and shortness of breath.   Cardiovascular: Negative for chest pain and leg swelling.  Gastrointestinal: Negative for abdominal distention, abdominal pain, constipation and diarrhea.  Endocrine: Negative.   Genitourinary: Negative for dysuria.  Musculoskeletal: Negative for joint swelling and myalgias.  Skin: Negative for rash.  Allergic/Immunologic: Negative.   Neurological: Negative for weakness, light-headedness and numbness.   Psychiatric/Behavioral: Negative for dysphoric mood and suicidal ideas.    Objective:  BP 136/84   Pulse 70   Temp 98.5 F (36.9 C) (Oral)   Ht 6' (1.829 m)   Wt 231 lb 12.8 oz (105.1 kg)   SpO2 98%   BMI 31.44 kg/m   BP/Weight 01/18/2019 08/24/2018 40/98/1191  Systolic BP 478 295 621  Diastolic BP 84 71 78  Wt. (Lbs) 231.8 234 229.2  BMI 31.44 31.74 31.09      Physical Exam Constitutional:      Appearance: He is well-developed.  Eyes:     Comments: PERRLA Erythema of nasal aspect of left bulbar conjunctiva. Red eyes normal  Cardiovascular:     Rate and Rhythm: Normal rate.     Heart sounds: Normal heart sounds. No murmur.  Pulmonary:     Effort: Pulmonary effort is normal.     Breath sounds: Normal breath sounds. No wheezing or rales.  Chest:     Chest wall: No tenderness.     Breasts:        Right: Mass (mobile, fluctuant non tender cyst measuring 0.5cm in diameter) present.   Abdominal:     General: Bowel sounds are normal. There is no distension.     Palpations: Abdomen is soft. There is no mass.     Tenderness: There is no abdominal tenderness.  Musculoskeletal: Normal range of motion.  Neurological:     Mental Status: He is alert and oriented to person, place, and time.  Psychiatric:        Mood and  Affect: Mood normal.     CMP Latest Ref Rng & Units 09/22/2017 08/26/2016 06/09/2016  Glucose 65 - 99 mg/dL 99 914(N100(H) 98  BUN 6 - 24 mg/dL 10 15 12   Creatinine 0.76 - 1.27 mg/dL 8.290.98 5.621.03 1.300.93  Sodium 134 - 144 mmol/L 143 140 138  Potassium 3.5 - 5.2 mmol/L 4.4 4.0 4.2  Chloride 96 - 106 mmol/L 101 105 104  CO2 20 - 29 mmol/L 23 27 27   Calcium 8.7 - 10.2 mg/dL 9.4 9.5 9.2  Total Protein 6.0 - 8.5 g/dL 7.6 7.7 7.5  Total Bilirubin 0.0 - 1.2 mg/dL 0.6 0.7 0.8  Alkaline Phos 39 - 117 IU/L 66 53 52  AST 0 - 40 IU/L 23 24 26   ALT 0 - 44 IU/L 37 27 26    Lipid Panel     Component Value Date/Time   CHOL 127 09/22/2017 0923   TRIG 129 09/22/2017 0923   HDL  35 (L) 09/22/2017 0923   CHOLHDL 3.6 09/22/2017 0923   CHOLHDL 3.4 08/26/2016 0910   VLDL 27 08/31/2015 0955   LDLCALC 66 09/22/2017 0923    CBC    Component Value Date/Time   WBC 7.5 09/22/2017 0923   WBC 4.6 08/26/2016 0910   RBC 5.27 09/22/2017 0923   RBC 5.55 08/26/2016 0910   HGB 15.7 09/22/2017 0923   HCT 47.2 09/22/2017 0923   PLT 222 09/22/2017 0923   MCV 90 09/22/2017 0923   MCH 29.8 09/22/2017 0923   MCH 30.1 08/26/2016 0910   MCHC 33.3 09/22/2017 0923   MCHC 34.1 08/26/2016 0910   RDW 13.3 09/22/2017 0923   LYMPHSABS 1.9 09/22/2017 0923   MONOABS 276 08/26/2016 0910   EOSABS 0.1 09/22/2017 0923   BASOSABS 0.0 09/22/2017 0923    Lab Results  Component Value Date   HGBA1C 5.1 09/23/2015    Assessment & Plan:   1. Cyst of right breast Non tender - US BREAST LTD UNI RIGHT INC AXILLA; Future  2. Acute viral conjunctivitis of left eye - ofloxacin (OCUFLOX) 0.3 % ophthalmic solution; Place 1 drop into the left eye 4 (four) times daily.  Dispense: 5 mL; Refill: 0   Meds ordered this encounter  Medications  . ofloxacin (OCUFLOX) 0.3 % ophthalmic solution    Sig: Place 1 drop into the left eye 4 (four) times daily.    Dispense:  5 mL    Refill:  0    Follow-up: Return if symptoms worsen or fail to improve.       Hoy RegisterEnobong Tiona Ruane, MD, FAAFP. [facility] [place], [place] [phone]   01/18/2019, 9:24 AM

## 2019-01-19 LAB — CMP14+EGFR
ALT: 33 IU/L (ref 0–44)
AST: 27 IU/L (ref 0–40)
Albumin/Globulin Ratio: 1.8 (ref 1.2–2.2)
Albumin: 4.6 g/dL (ref 4.0–5.0)
Alkaline Phosphatase: 63 IU/L (ref 39–117)
BUN/Creatinine Ratio: 17 (ref 9–20)
BUN: 16 mg/dL (ref 6–24)
Bilirubin Total: 0.4 mg/dL (ref 0.0–1.2)
CO2: 21 mmol/L (ref 20–29)
Calcium: 9.1 mg/dL (ref 8.7–10.2)
Chloride: 102 mmol/L (ref 96–106)
Creatinine, Ser: 0.94 mg/dL (ref 0.76–1.27)
GFR calc Af Amer: 113 mL/min/{1.73_m2} (ref >59)
GFR calc non Af Amer: 98 mL/min/{1.73_m2} (ref >59)
Globulin, Total: 2.6 g/dL (ref 1.5–4.5)
Glucose: 98 mg/dL (ref 65–99)
Potassium: 3.9 mmol/L (ref 3.5–5.2)
Sodium: 137 mmol/L (ref 134–144)
Total Protein: 7.2 g/dL (ref 6.0–8.5)

## 2019-01-24 ENCOUNTER — Ambulatory Visit
Admission: RE | Admit: 2019-01-24 | Discharge: 2019-01-24 | Disposition: A | Discharge status: Home / Self Care | Payer: No Typology Code available for payment source | Source: Ambulatory Visit | Attending: Family Medicine | Admitting: Family Medicine

## 2019-01-24 ENCOUNTER — Other Ambulatory Visit: Payer: Self-pay

## 2019-01-24 ENCOUNTER — Ambulatory Visit: Payer: Self-pay

## 2019-01-24 DIAGNOSIS — N6001 Solitary cyst of right breast: Secondary | ICD-10-CM

## 2019-01-26 ENCOUNTER — Telehealth: Payer: Self-pay

## 2019-01-26 NOTE — Telephone Encounter (Signed)
Patient was called and informed of lab results and mammogram results.

## 2019-01-26 NOTE — Telephone Encounter (Signed)
-----   Message from Charlott Rakes, MD sent at 01/25/2019  8:29 AM EDT ----- He has a cyst in his right breast and there is no evidence of malignancy from his mammogram report.

## 2019-01-26 NOTE — Telephone Encounter (Signed)
-----   Message from Charlott Rakes, MD sent at 01/22/2019  8:17 AM EDT ----- Please inform the patient that labs are normal. Thank you.

## 2019-05-02 ENCOUNTER — Ambulatory Visit: Payer: No Typology Code available for payment source | Admitting: Pharmacist

## 2019-06-18 ENCOUNTER — Ambulatory Visit: Payer: Self-pay | Attending: Family Medicine | Admitting: Family Medicine

## 2019-06-18 ENCOUNTER — Other Ambulatory Visit: Payer: Self-pay

## 2019-06-18 ENCOUNTER — Encounter: Payer: Self-pay | Admitting: Family Medicine

## 2019-06-18 ENCOUNTER — Ambulatory Visit (HOSPITAL_BASED_OUTPATIENT_CLINIC_OR_DEPARTMENT_OTHER): Payer: Self-pay | Admitting: Pharmacist

## 2019-06-18 DIAGNOSIS — Z0000 Encounter for general adult medical examination without abnormal findings: Secondary | ICD-10-CM

## 2019-06-18 DIAGNOSIS — Z23 Encounter for immunization: Secondary | ICD-10-CM

## 2019-06-18 DIAGNOSIS — E6609 Other obesity due to excess calories: Secondary | ICD-10-CM

## 2019-06-18 DIAGNOSIS — Z6831 Body mass index (BMI) 31.0-31.9, adult: Secondary | ICD-10-CM

## 2019-06-18 DIAGNOSIS — N6001 Solitary cyst of right breast: Secondary | ICD-10-CM

## 2019-06-18 NOTE — Progress Notes (Signed)
Patient has been called and DOB has been verified. Patient has been screened and transferred to PCP to start phone visit.     

## 2019-06-18 NOTE — Progress Notes (Signed)
Patient presents for vaccination against influenza per orders of Dr. Newlin. Consent given. Counseling provided. No contraindications exists. Vaccine administered without incident.   

## 2019-06-18 NOTE — Progress Notes (Signed)
Virtual Visit via Telephone Note  I connected with [place], on 06/18/2019 at 8:57 AM by telephone due to the COVID-19 pandemic and verified that I am speaking with the correct person using two identifiers.   Consent: I discussed the limitations, risks, security and privacy concerns of performing an evaluation and management service by telephone and the availability of in person appointments. I also discussed with the patient that there may be a patient responsible charge related to this service. The patient expressed understanding and agreed to proceed.   Location of Patient: Home  Location of Provider: Clinic   Persons participating in Telemedicine visit: [place] [id number] Dr. Margarita Rana     History of Present Illness: Seth Harris is a 43 year old male seen today for an annual physical. He does not exercise at all but ingests a good amount of vegetables and fruits. He had an office visit 4 months ago where he had complained of a cyst of the right breast and diagnostic mammogram was negative for malignancy.   He has no concerns today  Past Medical History:  Diagnosis Date  . Allergy   . Environmental allergies    No Known Allergies  Current Outpatient Medications on File Prior to Visit  Medication Sig Dispense Refill  . azithromycin (ZITHROMAX) 250 MG tablet Take 2 pills today then one pill daily x 4 days (Patient not taking: Reported on 01/18/2019) 6 tablet 0  . ofloxacin (OCUFLOX) 0.3 % ophthalmic solution Place 1 drop into the left eye 4 (four) times daily. (Patient not taking: Reported on 06/18/2019) 5 mL 0  . omeprazole (PRILOSEC) 20 MG capsule Take 1 capsule (20 mg total) by mouth daily. (Patient not taking: Reported on 06/18/2019) 30 capsule 6   No current facility-administered medications on file prior to visit.     Observations/Objective: Alert, awake, oriented x3 Not in acute  distress  Assessment and Plan: 1. Annual physical exam Counseled on 150 minutes of exercise per week, healthy eating (including decreased daily intake of saturated fats, cholesterol, added sugars, sodium), STI prevention, routine healthcare maintenance. - Complete Metabolic Panel with GFR - Lipid panel - CBC with Differential/Platelet - TSH - T4, free  2. Class 1 obesity due to excess calories without serious comorbidity with body mass index (BMI) of 31.0 to 31.9 in adult Counseled on reducing portion sizes, increasing physical activity   Follow Up Instructions: 6 months   I discussed the assessment and treatment plan with the patient. The patient was provided an opportunity to ask questions and all were answered. The patient agreed with the plan and demonstrated an understanding of the instructions.   The patient was advised to call back or seek an in-person evaluation if the symptoms worsen or if the condition fails to improve as anticipated.     I provided 15 minutes total of non-face-to-face time during this encounter including median intraservice time, reviewing previous notes, labs, imaging, medications, management and patient verbalized understanding.     Charlott Rakes, MD, FAAFP. [facility] and [place], [place]   06/18/2019, 8:57 AM

## 2019-06-19 LAB — CBC WITH DIFFERENTIAL/PLATELET
Basophils Absolute: 0.1 10*3/uL (ref 0.0–0.2)
Basos: 1 %
EOS (ABSOLUTE): 0.2 10*3/uL (ref 0.0–0.4)
Eos: 3 %
Hematocrit: 51.3 % — ABNORMAL HIGH (ref 37.5–51.0)
Hemoglobin: 17.3 g/dL (ref 13.0–17.7)
Immature Grans (Abs): 0 10*3/uL (ref 0.0–0.1)
Immature Granulocytes: 1 %
Lymphocytes Absolute: 2.4 10*3/uL (ref 0.7–3.1)
Lymphs: 38 %
MCH: 29.5 pg (ref 26.6–33.0)
MCHC: 33.7 g/dL (ref 31.5–35.7)
MCV: 88 fL (ref 79–97)
Monocytes Absolute: 0.4 10*3/uL (ref 0.1–0.9)
Monocytes: 6 %
Neutrophils Absolute: 3.2 10*3/uL (ref 1.4–7.0)
Neutrophils: 51 %
Platelets: 164 10*3/uL (ref 150–450)
RBC: 5.86 x10E6/uL — ABNORMAL HIGH (ref 4.14–5.80)
RDW: 12.4 % (ref 11.6–15.4)
WBC: 6.2 10*3/uL (ref 3.4–10.8)

## 2019-06-19 LAB — LIPID PANEL
Chol/HDL Ratio: 4.5 ratio (ref 0.0–5.0)
Cholesterol, Total: 167 mg/dL (ref 100–199)
HDL: 37 mg/dL — ABNORMAL LOW (ref >39)
LDL Chol Calc (NIH): 89 mg/dL (ref 0–99)
Triglycerides: 247 mg/dL — ABNORMAL HIGH (ref 0–149)
VLDL Cholesterol Cal: 41 mg/dL — ABNORMAL HIGH (ref 5–40)

## 2019-06-19 LAB — CMP14+EGFR
ALT: 35 IU/L (ref 0–44)
AST: 26 IU/L (ref 0–40)
Albumin/Globulin Ratio: 1.6 (ref 1.2–2.2)
Albumin: 4.7 g/dL (ref 4.0–5.0)
Alkaline Phosphatase: 64 IU/L (ref 39–117)
BUN/Creatinine Ratio: 15 (ref 9–20)
BUN: 14 mg/dL (ref 6–24)
Bilirubin Total: 0.4 mg/dL (ref 0.0–1.2)
CO2: 23 mmol/L (ref 20–29)
Calcium: 9.6 mg/dL (ref 8.7–10.2)
Chloride: 102 mmol/L (ref 96–106)
Creatinine, Ser: 0.92 mg/dL (ref 0.76–1.27)
GFR calc Af Amer: 116 mL/min/{1.73_m2} (ref >59)
GFR calc non Af Amer: 100 mL/min/{1.73_m2} (ref >59)
Globulin, Total: 3 g/dL (ref 1.5–4.5)
Glucose: 109 mg/dL — ABNORMAL HIGH (ref 65–99)
Potassium: 4.4 mmol/L (ref 3.5–5.2)
Sodium: 140 mmol/L (ref 134–144)
Total Protein: 7.7 g/dL (ref 6.0–8.5)

## 2019-06-19 LAB — T4, FREE: Free T4: 1.19 ng/dL (ref 0.82–1.77)

## 2019-06-19 LAB — TSH: TSH: 2.23 u[IU]/mL (ref 0.450–4.500)

## 2019-06-22 ENCOUNTER — Telehealth: Payer: Self-pay

## 2019-06-22 NOTE — Telephone Encounter (Signed)
Patient name and DOB has been verified Patient was informed of lab results. Patient had no questions.  

## 2019-06-22 NOTE — Telephone Encounter (Signed)
-----   Message from Charlott Rakes, MD sent at 06/19/2019  4:54 PM EST ----- Thyroid labs are normal, he is not anemic.  Total cholesterol is normal however triglycerides which is a type of cholesterol is slightly elevated.  Please encourage to adhere to a low-cholesterol diet and omega-3 fish oil capsules to be beneficial

## 2019-07-14 ENCOUNTER — Encounter (HOSPITAL_COMMUNITY): Payer: Self-pay | Admitting: Emergency Medicine

## 2019-07-14 ENCOUNTER — Other Ambulatory Visit: Payer: Self-pay

## 2019-07-14 ENCOUNTER — Emergency Department (HOSPITAL_COMMUNITY): Payer: No Typology Code available for payment source

## 2019-07-14 ENCOUNTER — Emergency Department (HOSPITAL_COMMUNITY)
Admission: EM | Admit: 2019-07-14 | Discharge: 2019-07-14 | Disposition: A | Discharge status: Home / Self Care | Payer: No Typology Code available for payment source | Attending: Emergency Medicine | Admitting: Emergency Medicine

## 2019-07-14 DIAGNOSIS — Z87891 Personal history of nicotine dependence: Secondary | ICD-10-CM | POA: Insufficient documentation

## 2019-07-14 DIAGNOSIS — U071 COVID-19: Secondary | ICD-10-CM

## 2019-07-14 LAB — POC SARS CORONAVIRUS 2 AG -  ED: SARS Coronavirus 2 Ag: POSITIVE — AB

## 2019-07-14 MED ORDER — ACETAMINOPHEN 325 MG PO TABS
650.0000 mg | ORAL_TABLET | Freq: Once | ORAL | Status: AC | PRN
  Administered 2019-07-14: 15:00:00 650 mg via ORAL
  Filled 2019-07-14: qty 2

## 2019-07-14 NOTE — ED Provider Notes (Signed)
[facility]   CSN: [id number] Arrival date & time: 07/14/19  1349     History Chief Complaint  Patient presents with  . ? COVID  . Fever  . Cough    [facility] is a 43 y.o. male presents to emergency department today with chief complaint of Covid-like symptoms x6 days.  His symptoms include low back pain, fever, nonproductive cough and generalized body aches.  He describes low back pain as aching feeling across his low back without radiation of pain. He is also reporting intermittent burning sensations in his lungs. He thinks he has had fevers at home but has not checked. He has been taking tylenol for his symptoms with minimal symptoms relief and drinking hot tea. He denies any sick contacts or contact with anyone known positive for covid-19. Also denies chills, congestions, sore throat, chest pain, shortness of breath, abdominal pain, nausea, emesis, urinary frequency, dysuria, gross hematuria, diarrhea    Due to language barrier, a video interpreter was present during the history-taking and subsequent discussion (and for part of the physical exam) with this patient.   Past Medical History:  Diagnosis Date  . Allergy   . Environmental allergies     Patient Active Problem List   Diagnosis Date Noted  . Bilateral knee pain 08/12/2016  . Bilateral inguinal hernia without obstruction or gangrene 05/22/2014  . History of inguinal hernia repair 05/06/2014  . Dyspepsia 03/27/2014  . Acid reflux 12/31/2013  . Inguinal pain 12/21/2013  . Back strain 12/21/2013  . RUQ pain 12/21/2013    Past Surgical History:  Procedure Laterality Date  . HERNIA REPAIR         Family History  Problem Relation Age of Onset  . Diabetes Mother   . Diabetes Brother     Social History   Tobacco Use  . Smoking status: Former Smoker    Types: Cigarettes    Quit date: 11/09/2001    Years since quitting: 17.6  .  Smokeless tobacco: Never Used  Substance Use Topics  . Alcohol use: No  . Drug use: No    Home Medications Prior to Admission medications   Medication Sig Start Date End Date Taking? Authorizing Provider  azithromycin (ZITHROMAX) 250 MG tablet Take 2 pills today then one pill daily x 4 days Patient not taking: Reported on 01/18/2019 08/24/18   Fulp, Cammie, MD  ofloxacin (OCUFLOX) 0.3 % ophthalmic solution Place 1 drop into the left eye 4 (four) times daily. Patient not taking: Reported on 06/18/2019 01/18/19   Hoy RegisterNewlin, Enobong, MD  omeprazole (PRILOSEC) 20 MG capsule Take 1 capsule (20 mg total) by mouth daily. Patient not taking: Reported on 06/18/2019 05/09/18   Hoy RegisterNewlin, Enobong, MD    Allergies    Patient has no known allergies.  Review of Systems   Review of Systems All other systems are reviewed and are negative for acute change except as noted in the HPI.  Physical Exam Updated Vital Signs BP (!) 145/85 (BP Location: Right Arm)   Pulse 95   Temp 99.5 F (37.5 C) (Oral)   Resp 16   SpO2 100%   Physical Exam Vitals and nursing note reviewed.  Constitutional:      General: He is not in acute distress.    Appearance: He is not ill-appearing.  HENT:     Head: Normocephalic and atraumatic.     Right Ear: Tympanic membrane and external ear normal.  Left Ear: Tympanic membrane and external ear normal.     Nose: Nose normal.     Mouth/Throat:     Mouth: Mucous membranes are moist.     Pharynx: Oropharynx is clear.  Eyes:     General: No scleral icterus.       Right eye: No discharge.        Left eye: No discharge.     Extraocular Movements: Extraocular movements intact.     Conjunctiva/sclera: Conjunctivae normal.     Pupils: Pupils are equal, round, and reactive to light.  Neck:     Vascular: No JVD.  Cardiovascular:     Rate and Rhythm: Normal rate and regular rhythm.     Pulses: Normal pulses.          Radial pulses are 2+ on the right side and 2+ on the left  side.     Heart sounds: Normal heart sounds.  Pulmonary:     Effort: Pulmonary effort is normal. No respiratory distress.     Breath sounds: Normal breath sounds.     Comments: Lungs clear to auscultation in all fields. Symmetric chest rise. No wheezing, rales, or rhonchi. Chest:     Chest wall: No tenderness.  Abdominal:     Tenderness: There is no right CVA tenderness or left CVA tenderness.     Comments: Abdomen is soft, non-distended, and non-tender in all quadrants. No rigidity, no guarding. No peritoneal signs.  Musculoskeletal:        General: Normal range of motion.     Cervical back: Normal range of motion.     Comments: Full range of motion of the cervical, thoracic, and lumbar spine. No cervical, thoracic, or lumbar spinal tenderness to palpation. No paraspinal tenderness. No step offs, crepitus or deformity palpated.   Skin:    General: Skin is warm and dry.     Capillary Refill: Capillary refill takes less than 2 seconds.  Neurological:     Mental Status: He is oriented to person, place, and time.     GCS: GCS eye subscore is 4. GCS verbal subscore is 5. GCS motor subscore is 6.     Comments: Fluent speech, no facial droop.  Psychiatric:        Behavior: Behavior normal.     ED Results / Procedures / Treatments   Labs (all labs ordered are listed, but only abnormal results are displayed) Labs Reviewed  POC SARS CORONAVIRUS 2 AG -  ED - Abnormal; Notable for the following components:      Result Value   SARS Coronavirus 2 Ag POSITIVE (*)    All other components within normal limits    EKG None  Radiology DG Chest Portable 1 View  Result Date: 07/14/2019 CLINICAL DATA:  Lower back pain, fever, nonproductive cough and body aches since Monday, former smoker, negative COVID test on Monday EXAM: PORTABLE CHEST 1 VIEW COMPARISON:  Portable exam 1753 hours compared to 10/31/2017 FINDINGS: Normal heart size, mediastinal contours, and pulmonary vascularity. Minimal  subsegmental atelectasis at LEFT base. Lungs otherwise clear. No acute infiltrate, pleural effusion or pneumothorax. Osseous structures unremarkable. IMPRESSION: Minimal LEFT basilar atelectasis. Electronically Signed   By: Lavonia Dana M.D.   On: 07/14/2019 18:03    Procedures Procedures (including critical care time)  Medications Ordered in ED Medications  acetaminophen (TYLENOL) tablet 650 mg (650 mg Oral Given 07/14/19 1511)    ED Course  I have reviewed the triage vital signs and the nursing  notes.  Pertinent labs & imaging results that were available during my care of the patient were reviewed by me and considered in my medical decision making (see chart for details). Vitals:   07/14/19 1420 07/14/19 1702 07/14/19 1707 07/14/19 1940  BP: (!) 145/94  (!) 142/87 (!) 145/85  Pulse: (!) 103  94 95  Resp: 18  13 16   Temp: (!) 101.1 F (38.4 C) 99.4 F (37.4 C) 99.5 F (37.5 C) 99.5 F (37.5 C)  TempSrc: Oral Oral Oral Oral  SpO2: 97%  99% 100%      MDM Rules/Calculators/A&P                      Patient seen and examined. He was noted to be febrile 101.1 and tachycardic 103 in triage, no hypoxia. He was given tylenol. Unfortunately patient had to wait 3 hours in the lobby due to long wait times. On my initial eval temperature improved 99.5. He looks to not feel well, however is non toxic in appearance. He does not appear dehydrated. He is speaking in full sentences, no signs of respiratory distress. Lungs are clear to auscultation in all fields. Abdomen is non tender, no peritoneal signs. No CVA tenderness,flank or groin pain. DDX includes covid, URI, pneumonia, unlikely kidney stone.  Chest xray viewed by me shows mild left base atelectasis. Rapid covid test is positive. Patient ambulated in the emergency department without respiratory distress, tachycardia, or hypoxia. SpO2 during ambulation >94% on room air. Patient given incentive spirometer with instructions provided by RN.  Discussed symptomatic care and quarantine instructions.  The patient appears reasonably screened and/or stabilized for discharge and I doubt any other medical condition or other [facility] requiring further screening, evaluation, or treatment in the ED at this time prior to discharge. The patient is safe for discharge with strict return precautions discussed. Recommend telemedicine pcp follow up if needed.  [facility] was evaluated in Emergency Department on 07/14/2019 for the symptoms described in the history of present illness. He was evaluated in the context of the global COVID-19 pandemic, which necessitated consideration that the patient might be at risk for infection with the SARS-CoV-2 virus that causes COVID-19. Institutional protocols and algorithms that pertain to the evaluation of patients at risk for COVID-19 are in a state of rapid change based on information released by regulatory bodies including the CDC and federal and state organizations. These policies and algorithms were followed during the patient's care in the ED.   Portions of this note were generated with 07/16/2019. Dictation errors may occur despite best attempts at proofreading.   Final Clinical Impression(s) / ED Diagnoses Final diagnoses:  COVID-19 virus infection    Rx / DC Orders ED Discharge Orders    None       Scientist, clinical (histocompatibility and immunogenetics), PA-C 07/14/19 2207    2208, DO 07/15/19 0119

## 2019-07-14 NOTE — ED Triage Notes (Signed)
C/o lower back pain, fever, non-productive cough, and body aches since Monday.

## 2019-07-14 NOTE — ED Notes (Signed)
Pt pulse ox stayed at 99 while ambulating.

## 2019-07-14 NOTE — Discharge Instructions (Signed)
Thank you for allowing us to care for you today.   Please return to the emergency department if you have any new or worsening symptoms.  You tested positive for covid-19 today.   Medications- You can take medications to help treat your symptoms: -Tylenol for fever and body aches. Please take as prescribed on the bottle. -Over the coutner cough medicine such as mucinex, robitussin, or other brands. -Flonase or saline nasal spray for nasal congestion -Vitamins as recommended by CDC  Treatment- This is a virus and unfortunately there are no antibitotics approved to treat this virus at this time. It is important to monitor your symptoms closely: -You should have a theremometer at home to check your temperature when feeling feverish. -Use a pulse ox meter to measure your oxygen when feeling short of breath.  -If your fever is over 100.4 despite taking tylenol or if your oxygen level drops below 94% these are reasons to rturn to the emergency department for further evaluation. Please call the emergency department before you come to make us aware.    We recommend you self-isolate for 10 days and to inform your work/family/friends that you has the virus.  They will need to self-quarantine for 14 days to monitor for symptoms.    Again: symptoms of shortness of breath, chest pain, difficulty breathing, new onset of confusion, any symptoms that are concerning. If any of these symptoms you should come to emergency department for evaluation.   I hope you feel better soon  

## 2019-08-01 ENCOUNTER — Encounter (HOSPITAL_COMMUNITY): Payer: Self-pay

## 2019-08-01 ENCOUNTER — Other Ambulatory Visit: Payer: Self-pay

## 2019-08-01 ENCOUNTER — Ambulatory Visit (HOSPITAL_COMMUNITY)
Admission: EM | Admit: 2019-08-01 | Discharge: 2019-08-01 | Disposition: A | Discharge status: Home / Self Care | Payer: No Typology Code available for payment source | Attending: Family Medicine | Admitting: Family Medicine

## 2019-08-01 ENCOUNTER — Ambulatory Visit (INDEPENDENT_AMBULATORY_CARE_PROVIDER_SITE_OTHER): Payer: No Typology Code available for payment source

## 2019-08-01 DIAGNOSIS — R0789 Other chest pain: Secondary | ICD-10-CM

## 2019-08-01 DIAGNOSIS — Z8616 Personal history of COVID-19: Secondary | ICD-10-CM

## 2019-08-01 MED ORDER — CYCLOBENZAPRINE HCL 5 MG PO TABS: 5.0000 mg | ORAL_TABLET | Freq: Two times a day (BID) | ORAL | 0 refills | Status: DC | PRN

## 2019-08-01 MED ORDER — IBUPROFEN 800 MG PO TABS: 800.0000 mg | ORAL_TABLET | Freq: Three times a day (TID) | ORAL | 0 refills | Status: DC

## 2019-08-01 MED ORDER — NAPROXEN 500 MG PO TABS: 500.0000 mg | ORAL_TABLET | Freq: Two times a day (BID) | ORAL | 0 refills | Status: DC

## 2019-08-01 NOTE — Discharge Instructions (Signed)
Xray and EKG normal Naprosyn twice daily for 10 days You may use flexeril as needed to help with pain. This is a muscle relaxer and causes sedation- please use only at bedtime or when you will be home and not have to drive/work Gentle stretching  If developing increased chest pain and/or shortness of breath follow up in emergency room

## 2019-08-01 NOTE — ED Provider Notes (Signed)
Seth Harris    CSN: [id number] Arrival date & time: 08/01/19  8937      History   Chief Complaint Chief Complaint  Patient presents with  . Shortness of Breath    HPI [name] Seth Harris is a 44 y.o. male history of GERD, presenting today for evaluation of right chest/shoulder pain and shortness of breath.  Patient states that he tested positive for Covid on 12/21.  He quarantine for 10 days and his symptoms largely resolved.  Over the past 4 to 5 days he has had increased pain in his right shoulder that extends into his chest as well as upper back.  Worse with movements as well as lying flat.  He has had associated shortness of breath with this especially with lying down.  He denies any cough.  Denies any pain on the left side.  Denies significant worsening with deep breaths.  Denies leg pain or leg swelling.  Did recently return back to work with [employer] and has been doing a lot of shoveling.  He has been using naproxen without relief.  Does report history of smoking 20 to 30 years ago, but no recent smoking or tobacco use.  Denies history of asthma.  HPI  Past Medical History:  Diagnosis Date  . Allergy   . Environmental allergies     Patient Active Problem List   Diagnosis Date Noted  . Bilateral knee pain 08/12/2016  . Bilateral inguinal hernia without obstruction or gangrene 05/22/2014  . History of inguinal hernia repair 05/06/2014  . Dyspepsia 03/27/2014  . Acid reflux 12/31/2013  . Inguinal pain 12/21/2013  . Back strain 12/21/2013  . RUQ pain 12/21/2013    Past Surgical History:  Procedure Laterality Date  . HERNIA REPAIR         Home Medications    Prior to Admission medications   Medication Sig Start Date End Date Taking? Authorizing Provider  cyclobenzaprine (FLEXERIL) 5 MG tablet Take 1-2 tablets (5-10 mg total) by mouth 2 (two) times daily as needed for muscle spasms. 08/01/19   Billal Rollo C, PA-C  ibuprofen (ADVIL)  800 MG tablet Take 1 tablet (800 mg total) by mouth 3 (three) times daily. 08/01/19   Edu On C, PA-C  omeprazole (PRILOSEC) 20 MG capsule Take 1 capsule (20 mg total) by mouth daily. Patient not taking: Reported on 06/18/2019 05/09/18 08/01/19  Charlott Rakes, MD    Family History Family History  Problem Relation Age of Onset  . Diabetes Mother   . Diabetes Brother     Social History Social History   Tobacco Use  . Smoking status: Former Smoker    Types: Cigarettes    Quit date: 11/09/2001    Years since quitting: 17.7  . Smokeless tobacco: Never Used  Substance Use Topics  . Alcohol use: No  . Drug use: No     Allergies   Patient has no known allergies.   Review of Systems Review of Systems  Constitutional: Negative for activity change, appetite change, chills, fatigue and fever.  HENT: Negative for congestion, ear pain, rhinorrhea, sinus pressure, sore throat and trouble swallowing.   Eyes: Negative for discharge and redness.  Respiratory: Positive for shortness of breath. Negative for cough and chest tightness.   Cardiovascular: Positive for chest pain. Negative for leg swelling.  Gastrointestinal: Negative for abdominal pain, diarrhea, nausea and vomiting.  Musculoskeletal: Positive for arthralgias and myalgias.  Skin: Negative for rash.  Neurological: Negative for dizziness, light-headedness and  headaches.     Physical Exam Triage Vital Signs ED Triage Vitals  Enc Vitals Group     BP 08/01/19 1007 139/80     Pulse Rate 08/01/19 1007 89     Resp 08/01/19 1007 20     Temp 08/01/19 1007 98.7 F (37.1 C)     Temp Source 08/01/19 1007 Oral     SpO2 08/01/19 1007 99 %     Weight --      Height --      Head Circumference --      Peak Flow --      Pain Score 08/01/19 1005 4     Pain Loc --      Pain Edu? --      Excl. in GC? --    No data found.  Updated Vital Signs BP 139/80 (BP Location: Left Arm)   Pulse 89   Temp 98.7 F (37.1 C) (Oral)    Resp 20   SpO2 99%   Visual Acuity Right Eye Distance:   Left Eye Distance:   Bilateral Distance:    Right Eye Near:   Left Eye Near:    Bilateral Near:     Physical Exam Vitals and nursing note reviewed.  Constitutional:      Appearance: He is well-developed.     Comments: No acute distress  HENT:     Head: Normocephalic and atraumatic.     Ears:     Comments: Bilateral ears without tenderness to palpation of external auricle, tragus and mastoid, EAC's without erythema or swelling, TM's with good bony landmarks and cone of light. Non erythematous.    Nose: Nose normal.     Mouth/Throat:     Comments: Oral mucosa pink and moist, no tonsillar enlargement or exudate. Posterior pharynx patent and nonerythematous, no uvula deviation or swelling. Normal phonation. Eyes:     Conjunctiva/sclera: Conjunctivae normal.  Neck:     Comments: Full active range of motion of neck Cardiovascular:     Rate and Rhythm: Normal rate.  Pulmonary:     Effort: Pulmonary effort is normal. No respiratory distress.     Comments: Breathing comfortably at rest, CTABL, no wheezing, rales or other adventitious sounds auscultated Abdominal:     General: There is no distension.  Musculoskeletal:        General: Normal range of motion.     Cervical back: Neck supple.     Comments: Mild tenderness to palpation of superior thoracic musculature on right as well as anterior pectoralis area Full active range of motion of right shoulder, grip strength 5/5 and equal bilaterally  Bilateral lower leg symmetric without calf swelling or tenderness  Skin:    General: Skin is warm and dry.  Neurological:     Mental Status: He is alert and oriented to person, place, and time.      UC Treatments / Results  Labs (all labs ordered are listed, but only abnormal results are displayed) Labs Reviewed - No data to display  EKG   Radiology DG Chest 2 View  Result Date: 08/01/2019 CLINICAL DATA:  COVID  positive, right-sided chest pain and shortness of breath for 5 days EXAM: CHEST - 2 VIEW COMPARISON:  07/14/2019. FINDINGS: The heart size and mediastinal contours are within normal limits. Both lungs are clear. Benign, calcified nodule of the right lung base. The visualized skeletal structures are unremarkable. IMPRESSION: No acute abnormality of the lungs. Electronically Signed   By: Erasmo Score.D.  On: 08/01/2019 11:05    Procedures Procedures (including critical care time)  Medications Ordered in UC Medications - No data to display  Initial Impression / Assessment and Plan / UC Course  I have reviewed the triage vital signs and the nursing notes.  Pertinent labs & imaging results that were available during my care of the patient were reviewed by me and considered in my medical decision making (see chart for details).     X-ray negative for intrapulmonary abnormality, EKG normal sinus rhythm, no acute signs of ischemia or infarction.  Discomfort most likely musculoskeletal secondary to returning to work after Covid infection.  Will treat with anti-inflammatories and muscle relaxers, do not suspect DVT/PE at this time.  Discussed strict return precautions. Patient verbalized understanding and is agreeable with plan.  Final Clinical Impressions(s) / UC Diagnoses   Final diagnoses:  Chest wall pain     Discharge Instructions     Xray and EKG normal Naprosyn twice daily for 10 days You may use flexeril as needed to help with pain. This is a muscle relaxer and causes sedation- please use only at bedtime or when you will be home and not have to drive/work Gentle stretching  If developing increased chest pain and/or shortness of breath follow up in emergency room   ED Prescriptions    Medication Sig Dispense Auth. Provider   naproxen (NAPROSYN) 500 MG tablet  (Status: Discontinued) Take 1 tablet (500 mg total) by mouth 2 (two) times daily. 30 tablet Jonluke Cobbins C, PA-C    cyclobenzaprine (FLEXERIL) 5 MG tablet Take 1-2 tablets (5-10 mg total) by mouth 2 (two) times daily as needed for muscle spasms. 24 tablet Kellene Mccleary C, PA-C   ibuprofen (ADVIL) 800 MG tablet Take 1 tablet (800 mg total) by mouth 3 (three) times daily. 21 tablet Adanna Zuckerman, [place] C, PA-C     PDMP not reviewed this encounter.   Lew Dawes, PA-C 08/01/19 1302

## 2019-08-01 NOTE — ED Triage Notes (Signed)
Patient presents to Urgent Care with complaints of shortness of breath since about 4 days ago. Patient reports he had covid 2 weeks ago and the ED gave him an incentive spirometer.

## 2019-08-06 ENCOUNTER — Other Ambulatory Visit: Payer: Self-pay

## 2019-08-06 ENCOUNTER — Ambulatory Visit: Payer: Self-pay

## 2019-08-08 ENCOUNTER — Ambulatory Visit: Payer: Self-pay | Attending: Family Medicine | Admitting: Family Medicine

## 2019-08-08 ENCOUNTER — Other Ambulatory Visit: Payer: Self-pay

## 2019-08-08 DIAGNOSIS — J3489 Other specified disorders of nose and nasal sinuses: Secondary | ICD-10-CM

## 2019-08-08 DIAGNOSIS — R0789 Other chest pain: Secondary | ICD-10-CM

## 2019-08-08 MED ORDER — CETIRIZINE HCL 10 MG PO TABS: 10.0000 mg | ORAL_TABLET | Freq: Every day | ORAL | 1 refills | Status: DC

## 2019-08-08 MED ORDER — NAPROXEN 500 MG PO TABS: 500.0000 mg | ORAL_TABLET | Freq: Two times a day (BID) | ORAL | 0 refills | Status: DC

## 2019-08-08 NOTE — Progress Notes (Signed)
Virtual Visit via Telephone Note  I connected with [facility] [name], on 08/08/2019 at 5:03 PM by telephone due to the COVID-19 pandemic and verified that I am speaking with the correct person using two identifiers.   Consent: I discussed the limitations, risks, security and privacy concerns of performing an evaluation and management service by telephone and the availability of in person appointments. I also discussed with the patient that there may be a patient responsible charge related to this service. The patient expressed understanding and agreed to proceed.   Location of Patient: Home  Location of Provider: Clinic   Persons participating in Telemedicine visit: Cleon Signorelli [place] - [place] #[id number] - Interpreter Dr. Alvis Lemmings     History of Present Illness: 44 year old male who presents today for a follow up visit. Diagnosed with COVID-19 infection in 07/14/19 after presenting to the Ed with fever, cough, myalgias.  Seen at the Urgent care on 08/01/19 for chest pain thought to be musculoskeletal after CXR was negative for acute cardiopulmonary process. Ibuprofen and Flexeril prescribed; he had to switch to Naproxen which he found to be more effective than Ibuprofen. Symptoms are better now but he does have some green phlegm in his throat in the mornings he states but is otherwise doing well.  Past Medical History:  Diagnosis Date  . Allergy   . Environmental allergies    No Known Allergies  Current Outpatient Medications on File Prior to Visit  Medication Sig Dispense Refill  . cyclobenzaprine (FLEXERIL) 5 MG tablet Take 1-2 tablets (5-10 mg total) by mouth 2 (two) times daily as needed for muscle spasms. 24 tablet 0  . ibuprofen (ADVIL) 800 MG tablet Take 1 tablet (800 mg total) by mouth 3 (three) times daily. 21 tablet 0  . [DISCONTINUED] omeprazole (PRILOSEC) 20 MG capsule Take 1 capsule (20 mg total) by mouth daily.  (Patient not taking: Reported on 06/18/2019) 30 capsule 6   No current facility-administered medications on file prior to visit.    Observations/Objective: Awake, alert, oriented x3 Not in acute distress  Assessment and Plan: 1. Musculoskeletal chest pain Improving - naproxen (NAPROSYN) 500 MG tablet; Take 1 tablet (500 mg total) by mouth 2 (two) times daily with a meal.  Dispense: 60 tablet; Refill: 0  2. Sinus drainage - cetirizine (ZYRTEC) 10 MG tablet; Take 1 tablet (10 mg total) by mouth daily.  Dispense: 30 tablet; Refill: 1   Follow Up Instructions: Return for medical conditions, keep previously scheduled appointment.    I discussed the assessment and treatment plan with the patient. The patient was provided an opportunity to ask questions and all were answered. The patient agreed with the plan and demonstrated an understanding of the instructions.   The patient was advised to call back or seek an in-person evaluation if the symptoms worsen or if the condition fails to improve as anticipated.     I provided 15 minutes total of non-face-to-face time during this encounter including median intraservice time, reviewing previous notes, investigations, ordering medications, medical decision making, coordinating care and patient verbalized understanding at the end of the visit.     Hoy Register, MD, FAAFP. [facility] [place], [place] [phone]   08/08/2019, 5:03 PM

## 2019-08-08 NOTE — Progress Notes (Signed)
Patient has been called and DOB has been verified. Patient has been screened and transferred to PCP to start phone visit.     

## 2019-08-27 ENCOUNTER — Ambulatory Visit: Payer: Self-pay

## 2019-08-27 ENCOUNTER — Other Ambulatory Visit: Payer: Self-pay

## 2020-04-01 ENCOUNTER — Other Ambulatory Visit: Payer: Self-pay

## 2020-04-01 ENCOUNTER — Ambulatory Visit: Payer: No Typology Code available for payment source

## 2020-04-20 IMAGING — DX DG CHEST 2V
2 series · 2 of 2 positions shown · non-contrast
Comparison: 07/14/2019.

CLINICAL DATA: COVID positive, right-sided chest pain and shortness
of breath for 5 days

EXAM:
CHEST - 2 VIEW

[chest pa]
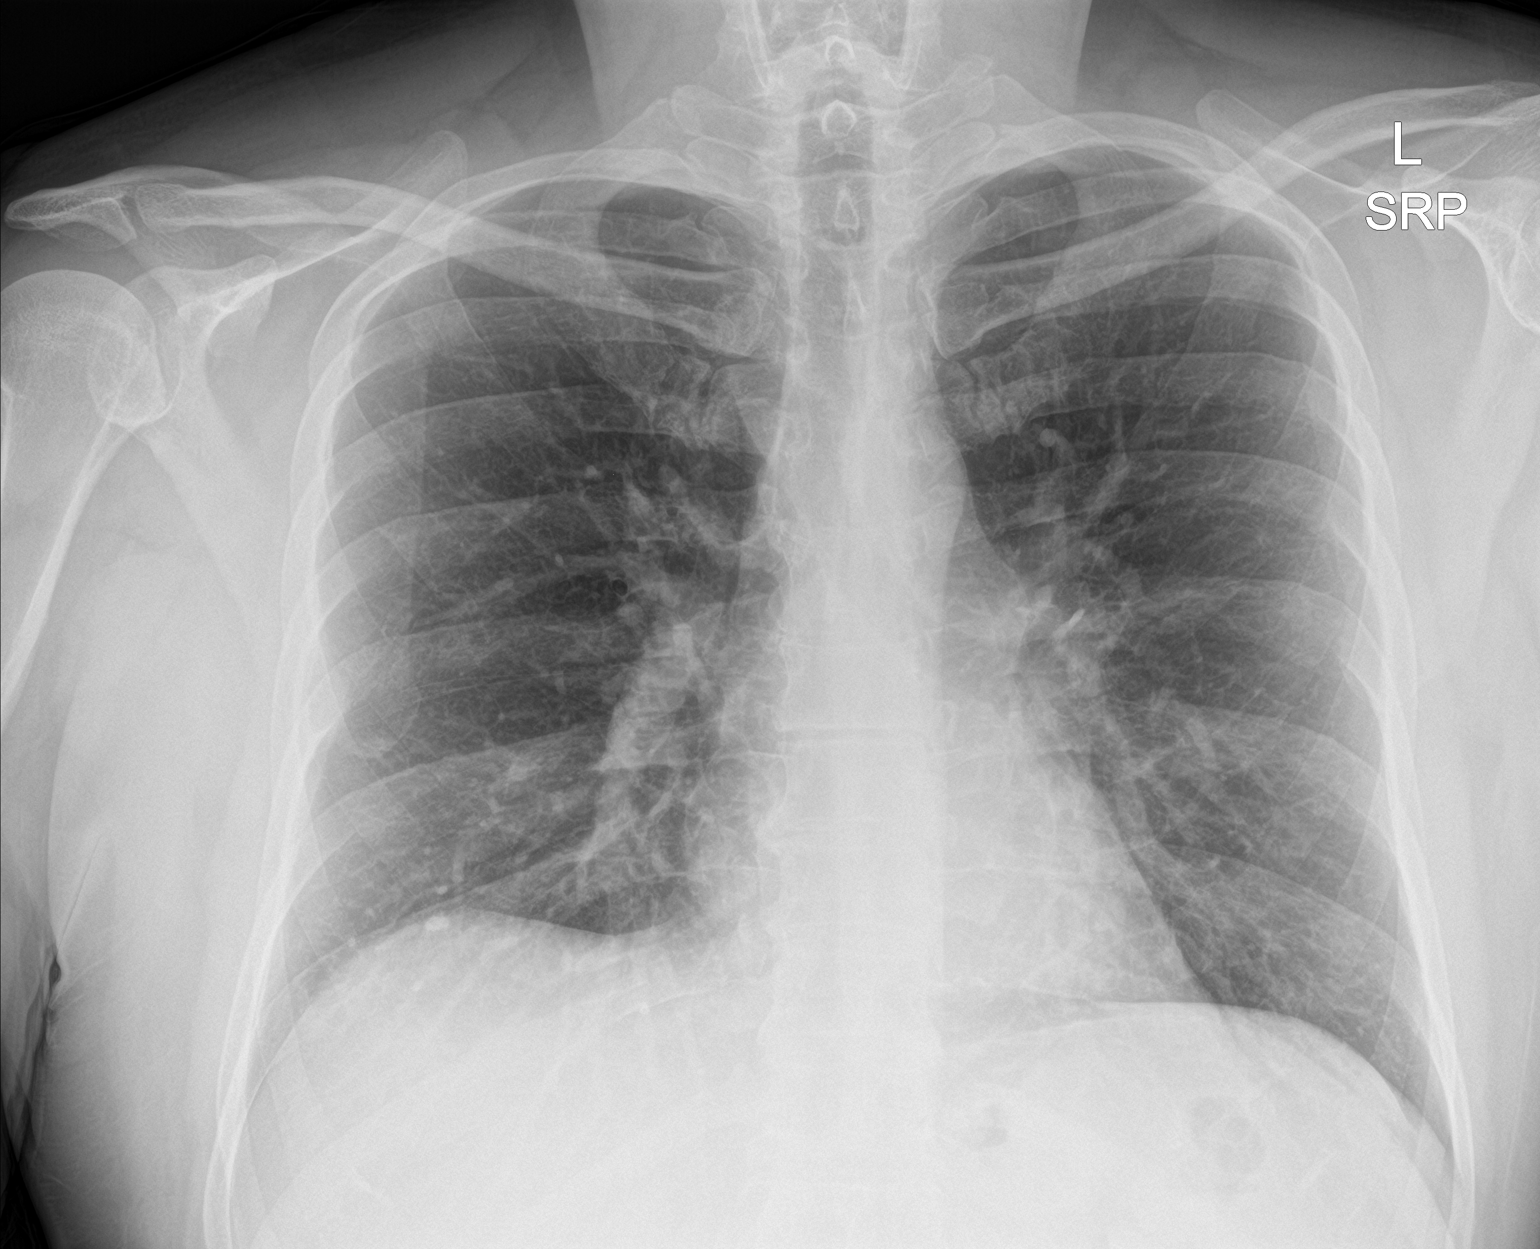

[chest lat]
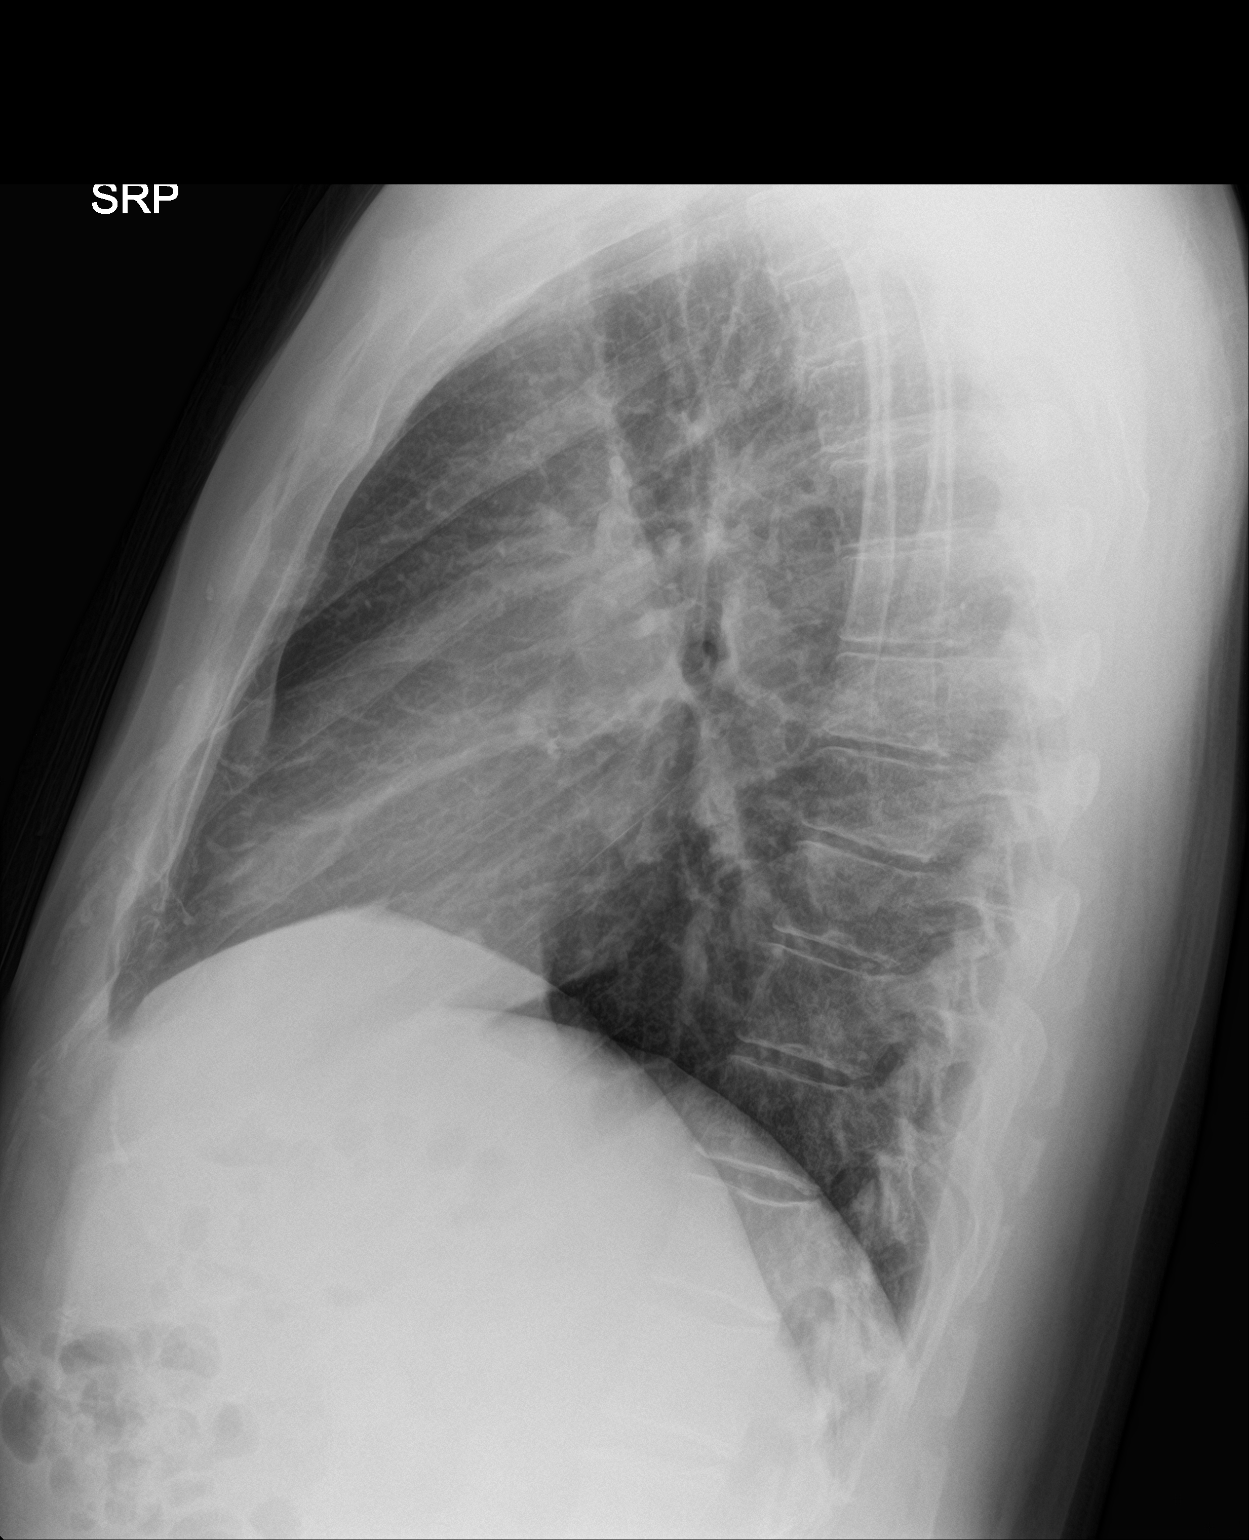

[2 of 2 positions shown; findings below may reference images not displayed]

FINDINGS: The heart size and mediastinal contours are within normal limits.
Both lungs are clear. Benign, calcified nodule of the right lung
base. The visualized skeletal structures are unremarkable.
IMPRESSION: No acute abnormality of the lungs.

## 2020-04-22 ENCOUNTER — Encounter: Payer: Self-pay | Admitting: Family Medicine

## 2020-04-22 ENCOUNTER — Other Ambulatory Visit: Payer: Self-pay | Admitting: Family Medicine

## 2020-04-22 ENCOUNTER — Other Ambulatory Visit: Payer: Self-pay

## 2020-04-22 ENCOUNTER — Ambulatory Visit: Payer: Self-pay | Attending: Family Medicine | Admitting: Family Medicine

## 2020-04-22 VITALS — BP 146/77 | HR 94 | Ht 72.0 in | Wt 233.6 lb

## 2020-04-22 DIAGNOSIS — I10 Essential (primary) hypertension: Secondary | ICD-10-CM

## 2020-04-22 DIAGNOSIS — L723 Sebaceous cyst: Secondary | ICD-10-CM

## 2020-04-22 DIAGNOSIS — Z0000 Encounter for general adult medical examination without abnormal findings: Secondary | ICD-10-CM

## 2020-04-22 DIAGNOSIS — Z13228 Encounter for screening for other metabolic disorders: Secondary | ICD-10-CM

## 2020-04-22 DIAGNOSIS — Z1159 Encounter for screening for other viral diseases: Secondary | ICD-10-CM

## 2020-04-22 MED ORDER — AMLODIPINE BESYLATE 2.5 MG PO TABS: 2.5000 mg | ORAL_TABLET | Freq: Every day | ORAL | 1 refills | Status: DC

## 2020-04-22 NOTE — Progress Notes (Signed)
Has questions about lumps in chest, he wants to have them removed.

## 2020-04-22 NOTE — Progress Notes (Signed)
Subjective:  Patient ID: Seth Harris, male    DOB: Mar 01, 1976  Age: 44 y.o. MRN: [id number]  CC: Annual Exam   HPI Seth Harris presents for an annual physical exam. He is requesting referral for excision of R breast sebaceous cyst confirmed by mammogram from 01/2019. Lesion is not painful.  Past Medical History:  Diagnosis Date  . Allergy   . Environmental allergies     Past Surgical History:  Procedure Laterality Date  . HERNIA REPAIR      Family History  Problem Relation Age of Onset  . Diabetes Mother   . Diabetes Brother     No Known Allergies  Outpatient Medications Prior to Visit  Medication Sig Dispense Refill  . cetirizine (ZYRTEC) 10 MG tablet Take 1 tablet (10 mg total) by mouth daily. 30 tablet 1  . cyclobenzaprine (FLEXERIL) 5 MG tablet Take 1-2 tablets (5-10 mg total) by mouth 2 (two) times daily as needed for muscle spasms. 24 tablet 0  . naproxen (NAPROSYN) 500 MG tablet Take 1 tablet (500 mg total) by mouth 2 (two) times daily with a meal. 60 tablet 0   No facility-administered medications prior to visit.     ROS Review of Systems  Constitutional: Negative for activity change and appetite change.  HENT: Negative for sinus pressure and sore throat.   Eyes: Negative for visual disturbance.  Respiratory: Negative for cough, chest tightness and shortness of breath.   Cardiovascular: Negative for chest pain and leg swelling.  Gastrointestinal: Negative for abdominal distention, abdominal pain, constipation and diarrhea.  Endocrine: Negative.   Genitourinary: Negative for dysuria.  Musculoskeletal: Negative for joint swelling and myalgias.  Skin: Negative for rash.  Allergic/Immunologic: Negative.   Neurological: Negative for weakness, light-headedness and numbness.  Psychiatric/Behavioral: Negative for dysphoric mood and suicidal ideas.    Objective:  BP (!) 146/77   Pulse 94   Ht 6' (1.829 m)   Wt 233 lb  9.6 oz (106 kg)   SpO2 99%   BMI 31.68 kg/m   BP/Weight 04/22/2020 08/01/2019 25/67/2091  Systolic BP 980 221 798  Diastolic BP 77 80 85  Wt. (Lbs) 233.6 - -  BMI 31.68 - -      Physical Exam Constitutional:      Appearance: He is well-developed.  HENT:     Head: Normocephalic and atraumatic.     Right Ear: External ear normal.     Left Ear: External ear normal.  Eyes:     Conjunctiva/sclera: Conjunctivae normal.     Pupils: Pupils are equal, round, and reactive to light.  Neck:     Trachea: No tracheal deviation.  Cardiovascular:     Rate and Rhythm: Normal rate and regular rhythm.     Heart sounds: Normal heart sounds. No murmur heard.   Pulmonary:     Effort: Pulmonary effort is normal. No respiratory distress.     Breath sounds: Normal breath sounds. No wheezing.  Chest:     Chest wall: No tenderness.     Breasts:        Right: Mass present.     Abdominal:     General: Bowel sounds are normal.     Palpations: Abdomen is soft. There is no mass.     Tenderness: There is no abdominal tenderness.  Genitourinary:    Penis: Normal.      Testes: Normal.  Musculoskeletal:        General: No tenderness. Normal range of  motion.     Cervical back: Normal range of motion and neck supple.  Skin:    General: Skin is warm and dry.  Neurological:     Mental Status: He is alert and oriented to person, place, and time.     CMP Latest Ref Rng & Units 06/18/2019 01/18/2019 09/22/2017  Glucose 65 - 99 mg/dL 109(H) 98 99  BUN 6 - 24 mg/dL '14 16 10  ' Creatinine 0.76 - 1.27 mg/dL 0.92 0.94 0.98  Sodium 134 - 144 mmol/L 140 137 143  Potassium 3.5 - 5.2 mmol/L 4.4 3.9 4.4  Chloride 96 - 106 mmol/L 102 102 101  CO2 20 - 29 mmol/L '23 21 23  ' Calcium 8.7 - 10.2 mg/dL 9.6 9.1 9.4  Total Protein 6.0 - 8.5 g/dL 7.7 7.2 7.6  Total Bilirubin 0.0 - 1.2 mg/dL 0.4 0.4 0.6  Alkaline Phos 39 - 117 IU/L 64 63 66  AST 0 - 40 IU/L '26 27 23  ' ALT 0 - 44 IU/L 35 33 37    Lipid Panel       Component Value Date/Time   CHOL 167 06/18/2019 0938   TRIG 247 (H) 06/18/2019 0938   HDL 37 (L) 06/18/2019 0938   CHOLHDL 4.5 06/18/2019 0938   CHOLHDL 3.4 08/26/2016 0910   VLDL 27 08/31/2015 0955   LDLCALC 89 06/18/2019 0938    CBC    Component Value Date/Time   WBC 6.2 06/18/2019 0938   WBC 4.6 08/26/2016 0910   RBC 5.86 (H) 06/18/2019 0938   RBC 5.55 08/26/2016 0910   HGB 17.3 06/18/2019 0938   HCT 51.3 (H) 06/18/2019 0938   PLT 164 06/18/2019 0938   MCV 88 06/18/2019 0938   MCH 29.5 06/18/2019 0938   MCH 30.1 08/26/2016 0910   MCHC 33.7 06/18/2019 0938   MCHC 34.1 08/26/2016 0910   RDW 12.4 06/18/2019 0938   LYMPHSABS 2.4 06/18/2019 0938   MONOABS 276 08/26/2016 0910   EOSABS 0.2 06/18/2019 0938   BASOSABS 0.1 06/18/2019 0938    Lab Results  Component Value Date   HGBA1C 5.1 09/23/2015    Assessment & Plan:   1. Annual physical exam Counseled on 150 minutes of exercise per week, healthy eating (including decreased daily intake of saturated fats, cholesterol, added sugars, sodium), STI prevention, routine healthcare maintenance. - CMP14+EGFR; Future - Lipid panel; Future - CBC with Differential/Platelet; Future  2. Need for hepatitis C screening test - HCV RNA quant rflx ultra or genotyp([facility]); Future  3. Screening for metabolic disorder   4. Sebaceous cyst Referred for excision per request - Ambulatory referral to General Surgery  5. Essential hypertension BP has been elevated on a number of occassions Will initiate amlodipine - amLODipine (NORVASC) 2.5 MG tablet; Take 1 tablet (2.5 mg total) by mouth daily.  Dispense: 90 tablet; Refill: 1   Meds ordered this encounter  Medications  . amLODipine (NORVASC) 2.5 MG tablet    Sig: Take 1 tablet (2.5 mg total) by mouth daily.    Dispense:  90 tablet    Refill:  1    Follow-up: Return in about 6 months (around 10/21/2020) for Hypertension.       Seth Rakes, MD, FAAFP. [facility] [place], [place]   04/22/2020, 8:56 PM

## 2020-04-22 NOTE — Patient Instructions (Signed)
Mantenimiento de la salud en los hombres Health Maintenance, Male Adoptar un estilo de vida saludable y recibir atencin preventiva son importantes para promover la salud y el bienestar. Consulte al mdico sobre:  El esquema adecuado para hacerse pruebas y exmenes peridicos.  Cosas que puede hacer por su cuenta para prevenir enfermedades y mantenerse sano. Qu debo saber sobre la dieta, el peso y el ejercicio? Consuma una dieta saludable   Consuma una dieta que incluya muchas verduras, frutas, productos lcteos con bajo contenido de grasa y protenas magras.  No consuma muchos alimentos ricos en grasas slidas, azcares agregados o sodio. Mantenga un peso saludable El ndice de masa muscular (IMC) es una medida que puede utilizarse para identificar posibles problemas de peso. Proporciona una estimacin de la grasa corporal basndose en el peso y la altura. Su mdico puede ayudarle a determinar su IMC y a lograr o mantener un peso saludable. Haga ejercicio con regularidad Haga ejercicio con regularidad. Esta es una de las prcticas ms importantes que puede hacer por su salud. La mayora de los adultos deben seguir estas pautas:  Realizar, al menos, 150minutos de actividad fsica por semana. El ejercicio debe aumentar la frecuencia cardaca y hacerlo transpirar (ejercicio de intensidad moderada).  Hacer ejercicios de fortalecimiento por lo menos dos veces por semana. Agregue esto a su plan de ejercicio de intensidad moderada.  Pasar menos tiempo sentados. Incluso la actividad fsica ligera puede ser beneficiosa. Controle sus niveles de colesterol y lpidos en la sangre Comience a realizarse anlisis de lpidos y colesterol en la sangre a los 20aos y luego reptalos cada 5aos. Es posible que necesite controlar los niveles de colesterol con mayor frecuencia si:  Sus niveles de lpidos y colesterol son altos.  Es mayor de 40aos.  Presenta un alto riesgo de padecer enfermedades  cardacas. Qu debo saber sobre las pruebas de deteccin del cncer? Muchos tipos de cncer pueden detectarse de manera temprana y, a menudo, pueden prevenirse. Segn su historia clnica y sus antecedentes familiares, es posible que deba realizarse pruebas de deteccin del cncer en diferentes edades. Esto puede incluir pruebas de deteccin de lo siguiente:  Cncer colorrectal.  Cncer de prstata.  Cncer de piel.  Cncer de pulmn. Qu debo saber sobre la enfermedad cardaca, la diabetes y la hipertensin arterial? Presin arterial y enfermedad cardaca  La hipertensin arterial causa enfermedades cardacas y aumenta el riesgo de accidente cerebrovascular. Es ms probable que esto se manifieste en las personas que tienen lecturas de presin arterial alta, tienen ascendencia africana o tienen sobrepeso.  Hable con el mdico sobre sus valores de presin arterial deseados.  Hgase controlar la presin arterial: ? Cada 3 a 5 aos si tiene entre 18 y 39 aos. ? Todos los aos si es mayor de 40aos.  Si tiene entre 65 y 75 aos y es fumador o sola fumar, pregntele al mdico si debe realizarse una prueba de deteccin de aneurisma artico abdominal (AAA) por nica vez. Diabetes Realcese exmenes de deteccin de la diabetes con regularidad. Este anlisis revisa el nivel de azcar en la sangre en ayunas. Hgase las pruebas de deteccin:  Cada tresaos despus de los 45aos de edad si tiene un peso normal y un bajo riesgo de padecer diabetes.  Con ms frecuencia y a partir de una edad inferior si tiene sobrepeso o un alto riesgo de padecer diabetes. Qu debo saber sobre la prevencin de infecciones? Hepatitis B Si tiene un riesgo ms alto de contraer hepatitis B, debe someterse   a un examen de deteccin de este virus. Hable con el mdico para averiguar si tiene riesgo de contraer la infeccin por hepatitis B. Hepatitis C Se recomienda un anlisis de sangre para:  Todos los que  nacieron entre 1945 y 1965.  Todas las personas que tengan un riesgo de haber contrado hepatitis C. Enfermedades de transmisin sexual (ETS)  Debe realizarse pruebas de deteccin de ITS todos los aos, incluidas la gonorrea y la clamidia, si: ? Es sexualmente activo y es menor de 24aos. ? Es mayor de 24aos, y el mdico le informa que corre riesgo de tener este tipo de infecciones. ? La actividad sexual ha cambiado desde que le hicieron la ltima prueba de deteccin y tiene un riesgo mayor de tener clamidia o gonorrea. Pregntele al mdico si usted tiene riesgo.  Pregntele al mdico si usted tiene un alto riesgo de contraer VIH. El mdico tambin puede recomendarle un medicamento recetado para ayudar a evitar la infeccin por el VIH. Si elige tomar medicamentos para prevenir el VIH, primero debe hacerse los anlisis de deteccin del VIH. Luego debe hacerse anlisis cada 3meses mientras est tomando los medicamentos. Siga estas instrucciones en su casa: Estilo de vida  No consuma ningn producto que contenga nicotina o tabaco, como cigarrillos, cigarrillos electrnicos y tabaco de mascar. Si necesita ayuda para dejar de fumar, consulte al mdico.  No consuma drogas.  No comparta agujas.  Solicite ayuda a su mdico si necesita apoyo o informacin para abandonar las drogas. Consumo de alcohol  No beba alcohol si el mdico se lo prohbe.  Si bebe alcohol: ? Limite la cantidad que consume de 0 a 2 medidas por da. ? Est atento a la cantidad de alcohol que hay en las bebidas que toma. En los [place], una medida equivale a una botella de cerveza de 12oz (355ml), un vaso de vino de 5oz (148ml) o un vaso de una bebida alcohlica de alta graduacin de 1oz (44ml). Instrucciones generales  Realcese los estudios de rutina de la salud, dentales y de la vista.  Mantngase al da con las vacunas.  Infrmele a su mdico si: ? Se siente deprimido con frecuencia. ? Alguna vez  ha sido vctima de maltrato o no se siente seguro en su casa. Resumen  Adoptar un estilo de vida saludable y recibir atencin preventiva son importantes para promover la salud y el bienestar.  Siga las instrucciones del mdico acerca de una dieta saludable, el ejercicio y la realizacin de pruebas o exmenes para detectar enfermedades.  Siga las instrucciones del mdico con respecto al control del colesterol y la presin arterial. Esta informacin no tiene como fin reemplazar el consejo del mdico. Asegrese de hacerle al mdico cualquier pregunta que tenga. Document Revised: 07/26/2018 Document Reviewed: 07/26/2018 Elsevier Patient Education  2020 Elsevier Inc.  

## 2020-04-23 ENCOUNTER — Ambulatory Visit: Payer: No Typology Code available for payment source | Attending: Family Medicine

## 2020-04-23 DIAGNOSIS — Z1159 Encounter for screening for other viral diseases: Secondary | ICD-10-CM

## 2020-04-23 DIAGNOSIS — Z0000 Encounter for general adult medical examination without abnormal findings: Secondary | ICD-10-CM

## 2020-04-25 ENCOUNTER — Telehealth: Payer: Self-pay

## 2020-04-25 LAB — CMP14+EGFR
ALT: 33 IU/L (ref 0–44)
AST: 26 IU/L (ref 0–40)
Albumin/Globulin Ratio: 1.9 (ref 1.2–2.2)
Albumin: 5 g/dL (ref 4.0–5.0)
Alkaline Phosphatase: 65 IU/L (ref 44–121)
BUN/Creatinine Ratio: 16 (ref 9–20)
BUN: 15 mg/dL (ref 6–24)
Bilirubin Total: 0.6 mg/dL (ref 0.0–1.2)
CO2: 25 mmol/L (ref 20–29)
Calcium: 9.4 mg/dL (ref 8.7–10.2)
Chloride: 102 mmol/L (ref 96–106)
Creatinine, Ser: 0.95 mg/dL (ref 0.76–1.27)
GFR calc Af Amer: 111 mL/min/{1.73_m2} (ref >59)
GFR calc non Af Amer: 96 mL/min/{1.73_m2} (ref >59)
Globulin, Total: 2.6 g/dL (ref 1.5–4.5)
Glucose: 113 mg/dL — ABNORMAL HIGH (ref 65–99)
Potassium: 4.4 mmol/L (ref 3.5–5.2)
Sodium: 140 mmol/L (ref 134–144)
Total Protein: 7.6 g/dL (ref 6.0–8.5)

## 2020-04-25 LAB — LIPID PANEL
Chol/HDL Ratio: 4.9 ratio (ref 0.0–5.0)
Cholesterol, Total: 163 mg/dL (ref 100–199)
HDL: 33 mg/dL — ABNORMAL LOW (ref >39)
LDL Chol Calc (NIH): 94 mg/dL (ref 0–99)
Triglycerides: 209 mg/dL — ABNORMAL HIGH (ref 0–149)
VLDL Cholesterol Cal: 36 mg/dL (ref 5–40)

## 2020-04-25 LAB — CBC WITH DIFFERENTIAL/PLATELET
Basophils Absolute: 0.1 10*3/uL (ref 0.0–0.2)
Basos: 1 %
EOS (ABSOLUTE): 0.1 10*3/uL (ref 0.0–0.4)
Eos: 2 %
Hematocrit: 50.6 % (ref 37.5–51.0)
Hemoglobin: 17.1 g/dL (ref 13.0–17.7)
Immature Grans (Abs): 0 10*3/uL (ref 0.0–0.1)
Immature Granulocytes: 0 %
Lymphocytes Absolute: 2.1 10*3/uL (ref 0.7–3.1)
Lymphs: 37 %
MCH: 30.2 pg (ref 26.6–33.0)
MCHC: 33.8 g/dL (ref 31.5–35.7)
MCV: 89 fL (ref 79–97)
Monocytes Absolute: 0.5 10*3/uL (ref 0.1–0.9)
Monocytes: 9 %
Neutrophils Absolute: 2.9 10*3/uL (ref 1.4–7.0)
Neutrophils: 51 %
Platelets: 167 10*3/uL (ref 150–450)
RBC: 5.66 x10E6/uL (ref 4.14–5.80)
RDW: 12.4 % (ref 11.6–15.4)
WBC: 5.7 10*3/uL (ref 3.4–10.8)

## 2020-04-25 LAB — HCV RNA QUANT RFLX ULTRA OR GENOTYP: HCV Quant Baseline: NOT DETECTED IU/mL

## 2020-04-25 NOTE — Telephone Encounter (Signed)
Patient name and DOB has been verified Patient was informed of lab results. Patient had no questions.  

## 2020-04-25 NOTE — Telephone Encounter (Signed)
-----   Message from Hoy Register, MD sent at 04/25/2020 11:27 AM EDT ----- Labs are normal except for slightly elevated triglycerides which is a type of cholesterol but this has improved compared to previous labs. Advise to adhere to a low cholesterol diet and exercise.

## 2020-04-30 ENCOUNTER — Other Ambulatory Visit: Payer: Self-pay

## 2020-04-30 ENCOUNTER — Ambulatory Visit (INDEPENDENT_AMBULATORY_CARE_PROVIDER_SITE_OTHER): Payer: Self-pay | Admitting: Surgery

## 2020-04-30 ENCOUNTER — Encounter: Payer: Self-pay | Admitting: Surgery

## 2020-04-30 VITALS — BP 135/86 | HR 65 | Temp 98.0°F | Resp 12 | Ht 72.0 in | Wt 231.6 lb

## 2020-04-30 DIAGNOSIS — N6081 Other benign mammary dysplasias of right breast: Secondary | ICD-10-CM

## 2020-04-30 NOTE — Progress Notes (Signed)
04/30/2020  Reason for Visit:  Sebaceous cyst of right chest wall  Referring Provider:  Hoy Register, MD  History of Present Illness: Seth Harris is a 44 y.o. male presenting for evaluation of a right anterior chest wall sebaceous cyst.  The patient thinks he's had this for about 2-3 years but is unsure.  It was not bothering before but recently it's been causing some discomfort.  He had a mammogram on 01/24/2019 which showed a superficial 7 mm skin lesion or mass but otherwise negative for any actual breast pathology.  Denies any drainage or erythema, but he recently tried to manually drain the cyst, which then started some of the discomfort.  Denies any other areas of discomfort.  Denies any prior procedures in the area.  Past Medical History: Past Medical History:  Diagnosis Date  . Allergy   . Environmental allergies   . Hypertension      Past Surgical History: Past Surgical History:  Procedure Laterality Date  . HERNIA REPAIR      Home Medications: Prior to Admission medications   Medication Sig Start Date End Date Taking? Authorizing Provider  amLODipine (NORVASC) 2.5 MG tablet Take 1 tablet (2.5 mg total) by mouth daily. 04/22/20  Yes Hoy Register, MD  cetirizine (ZYRTEC) 10 MG tablet Take 1 tablet (10 mg total) by mouth daily. 08/08/19  Yes Hoy Register, MD  cyclobenzaprine (FLEXERIL) 5 MG tablet Take 1-2 tablets (5-10 mg total) by mouth 2 (two) times daily as needed for muscle spasms. 08/01/19  Yes Wieters, Hallie C, PA-C  naproxen (NAPROSYN) 500 MG tablet Take 1 tablet (500 mg total) by mouth 2 (two) times daily with a meal. 08/08/19  Yes Newlin, Enobong, MD  omeprazole (PRILOSEC) 20 MG capsule Take 1 capsule (20 mg total) by mouth daily. Patient not taking: Reported on 06/18/2019 05/09/18 08/01/19  Hoy Register, MD    Allergies: No Known Allergies  Social History:  reports that he quit smoking about 18 years ago. His smoking use included  cigarettes. He has never used smokeless tobacco. He reports that he does not drink alcohol and does not use drugs.   Family History: Family History  Problem Relation Age of Onset  . Diabetes Mother   . Diabetes Brother     Review of Systems: Review of Systems  Constitutional: Negative for chills and fever.  Respiratory: Negative for shortness of breath.   Cardiovascular: Negative for chest pain.  Gastrointestinal: Negative for abdominal pain, nausea and vomiting.  Skin: Negative for rash.    Physical Exam BP 135/86   Pulse 65   Temp 98 F (36.7 C)   Resp 12   Ht 6' (1.829 m)   Wt 231 lb 9.6 oz (105.1 kg)   SpO2 98%   BMI 31.41 kg/m  CONSTITUTIONAL: No acute distress, well nourished. HEENT:  Normocephalic, atraumatic, extraocular motion intact. RESPIRATORY:  Normal respiratory effort without pathologic use of accessory muscles. CARDIOVASCULAR: Regular rhythm and rate GI: The abdomen is soft, non-distended, non-tender.  MUSCULOSKELETAL:  Normal muscle strength and tone in all four extremities.  No peripheral edema or cyanosis. SKIN: Right anterior chest wall, about 7 o'clock, 5 cm from nipple, the patient has an approximately 1 cm mass at the skin level, firm, mobile, non-tender. NEUROLOGIC:  Motor and sensation is grossly normal.  Cranial nerves are grossly intact. PSYCH:  Alert and oriented to person, place and time. Affect is normal.  Laboratory Analysis: No results found for this or any previous visit (  from the past 24 hour(s)).  Imaging: Mammogram 01/24/19 FINDINGS: In the lower outer quadrant of the right breast there is a superficial 7 mm mass. No suspicious mass or malignant type microcalcifications identified in either breast.  On physical exam, there is a superficial skin lesion at 7 o'clock 6 cm from the nipple. It is consistent with a sebaceous cyst.  Mammographic images were processed with CAD.  IMPRESSION: Sebaceous cyst in the right breast accounts  for the palpable abnormality. No evidence of malignancy in either breast.  Assessment and Plan: This is a 44 y.o. male with a right anterior chest wall sebaceous cyst.  --Discussed with the patient that we can excise the cyst as an office procedure.  Discussed with him the procedure at length, using local anesthetic.  Reviewed risks of bleeding, infection, injury to surrounding structures, and he's willing to proceed.  Discussed post-procedure recovery and activity restrictions. --We'll schedule him for office procedure on 10/22.  Face-to-face time spent with the patient and care providers was 40 minutes, with more than 50% of the time spent counseling, educating, and coordinating care of the patient.     Howie Ill, MD [facility]

## 2020-04-30 NOTE — Patient Instructions (Signed)
We will schedule an in office procedure to remove the cyst. See your appointment below. Call the office if you have any questions or concerns.   Programaremos un procedimiento en el consultorio para eliminar el quiste. Vea su cita a continuacin. Llame a la oficina si tiene alguna pregunta o inquietud.  Escisin de lesiones en la piel Excision of Skin Lesions La escisin de una lesin en la piel consiste en extirpar una parte de piel mediante pequeos cortes (incisiones) en la piel. A travs de [place], se retira la lesin por completo. Este procedimiento se realiza con frecuencia para tratar o prevenir un cncer o una infeccin. Tambin puede hacerse para mejorar el aspecto esttico. El procedimiento se puede realizar para extirpar lo siguiente:  Tumores cancerosos (malignos), como el carcinoma basocelular, el carcinoma espinocelular o el melanoma.  Tumores no cancerosos (benignos), como un quiste o un lipoma.  Crecimientos celulares, tales como los lunares o los papilomas cutneos, que pueden ser extirpados por motivos estticos. Pueden utilizarse diversas tcnicas de escisin o ciruga dependiendo de su afeccin, del lugar de la lesin y de su salud general. Informe al mdico acerca de lo siguiente:  Cualquier alergia que tenga.  Todos los [employer], incluidos vitaminas, hierbas, gotas oftlmicas, cremas y [address] de [address].  Cualquier problema previo que usted o algn miembro de su familia haya tenido con los anestsicos.  Cualquier trastorno de la sangre que tenga.  Cirugas a las que se haya sometido.  Cualquier afeccin que tenga o haya tenido.  Si est embarazada o podra estarlo. Cules son los riesgos? En general, se trata de un procedimiento seguro. Sin embargo, pueden ocurrir complicaciones, por ejemplo:  Sangrado.  Infeccin.  Formacin de cicatrices.  Recurrencia del quiste, lipoma o cncer.  Cambios en el aspecto o la sensibilidad  de la piel, tales como cambio de color o hinchazn.  Reaccin a la anestesia.  Reaccin alrgica a los ungentos o materiales quirrgicos.  [employer] nervios, los vasos sanguneos, los msculos u otras estructuras.  Dolor continuo. Qu ocurre antes del procedimiento? Medicamentos Consulte al mdico sobre:  Multimedia programmer o suspender los medicamentos que toma habitualmente. Esto es muy importante si toma medicamentos para la diabetes o anticoagulantes.  Tomar medicamentos como aspirina e ibuprofeno. Estos medicamentos pueden tener un efecto anticoagulante en la [place]. No tome estos medicamentos a menos que el mdico se lo indique.  Tomar medicamentos de [employer], vitaminas, hierbas y suplementos. Indicaciones generales  Posiblemente le indiquen que deje de fumar.  Pueden hacerle un examen o anlisis.  Pregntele al mdico qu medidas se tomarn para ayudar a prevenir una infeccin. Estas pueden incluir: ? Rasurar el vello del lugar de la ciruga. ? Lavar la piel con un jabn antisptico. ? Suministrar antibiticos. Qu ocurre durante el procedimiento?   Le administrarn un medicamento para adormecer la zona (anestesia local).  El mdico extirpar las lesiones mediante una de las siguientes tcnicas de escisin. ? Escisin [employer]. Este procedimiento puede hacerse para tratar un tumor canceroso o una lesin o un quiste no canceroso.  Se utiliza un bistur pequeo o tijeras para cortar con cuidado la zona que rodea y que est debajo de la lesin hasta extraerla por completo.  Si se produce sangrado, se detiene con un dispositivo que aplica calor (electrocauterizacin).  Los bordes de la herida se pueden unir cosindolos con puntos (suturas).  Le colocarn una venda (vendaje).  Se enviarn muestras a un laboratorio para su anlisis. ?  Escisin de un quiste.  Le harn una incisin en el quiste.  El quiste se extraer entero a travs de la incisin.  La  incisin puede cerrarse con suturas. ? Escisin por rasurado. Esto se puede hacer para extirpar un lunar o un papiloma cutneo.  Se utiliza una navaja pequea o un instrumento tipo asa que se calienta elctricamente para cortar la lesin.  En general, se deja que la herida cicatrice sola, sin suturas. ? Escisin en sacabocados. Esto puede hacerse para extraer un lunar o una cicatriz o para hacer una biopsia de la lesin.  Se utiliza un pequeo instrumento similar a un cortador de galletas o un sacabocados para cortar un crculo de piel.  Los bordes externos de la piel se unen con suturas.  Es posible que la [place] se enve a un laboratorio para su anlisis. ? Ciruga microgrfica de [place]. Generalmente se realiza para Animator de piel. Este tipo de escisin se Nurse, adult en la cara y las [place]. Este procedimiento es mnimamente invasivo y garantiza el mejor resultado esttico.  Se utiliza un bistur o un instrumento tipo asa para extraer capas de la lesin hasta que se haya extirpado todo el tejido anormal o canceroso.  La herida se puede suturar dependiendo de su tamao.  El tejido se analiza con un microscopio de inmediato. Cada una de las tcnicas puede variar segn el mdico y el hospital. Al final de cualquiera de estos procedimientos, se aplica un ungento con antibitico, segn sea necesario. Qu ocurre despus del procedimiento?  Retome sus actividades normales como se lo haya indicado el mdico. Pregntele al mdico qu actividades son seguras para usted.  Es su responsabilidad retirar [employer] del procedimiento. Pregntele al mdico o consulte en el departamento que Sales executive procedimiento cundo [employer].  Hable con el mdico acerca de los [employer], las opciones de tratamiento y la posible necesidad de Education officer, environmental ms [employer].  Concurra a todas las visitas de [address] se lo haya indicado el mdico. Esto es  importante. Resumen  La escisin de una lesin en la piel consiste en extirpar una parte de piel mediante pequeos cortes (incisiones) en la piel. Este procedimiento se realiza con frecuencia para tratar o prevenir un cncer o una infeccin, o se puede realizar para Buyer, retail.  Pueden utilizarse diversas tcnicas de escisin o ciruga dependiendo de su afeccin, del lugar de la lesin y de su salud general.  Despus del procedimiento, hable con el mdico acerca de los [place] de los [place], las opciones de tratamiento y la posible necesidad de Education officer, environmental ms [employer].  Concurra a todas las visitas de [address] se lo haya indicado el mdico. Esto es importante. Esta informacin no tiene Theme park manager el consejo del mdico. Asegrese de hacerle al mdico cualquier pregunta que tenga. Document Revised: 02/16/2018 Document Reviewed: 02/16/2018 Elsevier Patient Education  2020 [employer].

## 2020-05-09 ENCOUNTER — Other Ambulatory Visit: Payer: Self-pay

## 2020-05-09 ENCOUNTER — Other Ambulatory Visit: Payer: Self-pay | Admitting: Surgery

## 2020-05-09 ENCOUNTER — Ambulatory Visit (INDEPENDENT_AMBULATORY_CARE_PROVIDER_SITE_OTHER): Payer: Self-pay | Admitting: Surgery

## 2020-05-09 ENCOUNTER — Ambulatory Visit: Payer: Self-pay | Admitting: Surgery

## 2020-05-09 ENCOUNTER — Encounter: Payer: Self-pay | Admitting: Surgery

## 2020-05-09 VITALS — BP 145/78 | HR 85 | Temp 98.5°F | Ht 72.0 in | Wt 228.0 lb

## 2020-05-09 DIAGNOSIS — N6081 Other benign mammary dysplasias of right breast: Secondary | ICD-10-CM

## 2020-05-09 NOTE — Progress Notes (Addendum)
  Procedure Date:  05/09/2020  Pre-operative Diagnosis:  Right anterior chest wall sebaceous cyst  Post-operative Diagnosis:  Right anterior chest wall sebaceous cyst  Procedure:  Excision of right anterior chest wall sebaceous cyst  Surgeon:  Howie Ill, MD  Anesthesia:  2 ml of 1% lidocaine with epi  Estimated Blood Loss:  5 ml  Specimens:  Sebaceous cyst  Complications:  None  Indications for Procedure:  This is a 44 y.o. male with a right anterior chest wall sebaceous cyst.  Had a negative mammogram as part of the workup for it.  The patient wishes to have this excised. The risks of bleeding, abscess or infection, injury to surrounding structures, and need for further procedures were all discussed with the patient and he was willing to proceed.  Description of Procedure: The patient was correctly identified at bedside.  The patient was placed supine.  Appropriate time-outs were performed.  The patient's right anterior chest wall was prepped and draped in usual sterile fashion.  Local anesthetic was infiltrated at the site of excision.  A 2 cm elliptical incision was made over the cyst, and scalpel was used to cut down to the subcutaneous tissue.  Skin flaps were created and the cyst was removed intact.  Hemostasis was good.  The cavity was irrigated.  The wound was then closed in two layers using 3-0 Vicryl and 4-0 Monocryl.  The incision was cleaned and sealed with DermaBond.  The patient tolerated the procedure well and all sharps were appropriately disposed of at the end of the case.   --May take Tylenol or Ibuprofen for pain control. --May apply ice pack over the wound for comfort. --Follow up in 2 weeks for wound check and review pathology.   Howie Ill, MD

## 2020-05-09 NOTE — Patient Instructions (Addendum)
We have removed a Cyst in our office today.  You have sutures under the skin that will dissolve and also dermabond (skin glue) on top of your skin which will come off on it's own in 10-14 days.  You may shower in 24 hours.   Avoid Strenuous activities that will make you sweat during the next 48 hours to avoid the glue coming off prematurely. Avoid activities that will place pressure to this area of the body for 1-2 weeks to avoid re-injury to incision site.  Please see your follow-up appointment provided. We will see you back in office to make sure this area is healed and to review the final pathology. If you have any questions or concerns prior to this appointment, call our office and speak with a nurse.   Exciso de leses cutneas, cuidados aps o procedimento Excision of Skin Lesions, Care After Este folheto oferece informaes sobre como cuidar de si aps o seu procedimento. Seu mdico tambm poder fornecer instrues mais especficas. Caso tenha problemas ou perguntas, entre em contato com o seu mdico. O que posso esperar aps o procedimento? Aps o procedimento,  comum sentir dor ou desconforto no local da exciso. Siga essas instrues em casa: Dentist [employer] as instrues do seu mdico sobre como cuidar do local de exciso. Certifique-se de: ? Lavar as mos com gua e sabo antes e depois de trocar sua gaze (curativo). Caso gua e sabo no estejam disponveis, use gel antissptico para as mos. ? Troque o curativo como indicado pelo seu mdico. ? No remova os pontos (suturas), a cola cirrgica ou as faixas adesivas. Pode ser necessrio deixar esses fechamentos de ferida no lugar por 2 semanas ou mais. Caso as [employer] comecem a se soltar ou dobrar, voc pode cortar as extremidades soltas. No remova faixas adesivas completamente a menos que seu mdico lhe diga para fazer isso.  Examine o local da exciso todos os dias, verificando se h sinais  de infeco. Fique atento em relao a: ? Vermelhido, inchao ou dor. ? Lquido ou sangue. ? Calor. ? Pus ou mau cheiro.  Mantenha o local limpo, seco e protegido por pelo menos 48 horas.  Em caso de sangramento, aplique uma presso [place], porm [place], na rea usando uma toalha dobrada por 20 minutos.  Evite exerccios e atividades de alto impacto at que as suturas sejam tiradas ou at a Retail buyer. Instrues gerais  Tome medicamentos vendidos com ou sem receita mdica somente de acordo com as indicaes do seu mdico.  Siga as instrues do seu mdico sobre como minimizar cicatrizes. A cicatriz deve diminuir com o tempo.  Evite exposio ao sol at que a rea tenha cicatrizado. Use protetor solar para proteger a rea do sol depois de ter cicatrizado.  Comparea a todas as consultas de acompanhamento de acordo com as orientaes do seu mdico. Isso  importante. Entre em contato com um mdico se:  Cabin crew vermelhido, inchao ou dor em torno do Product manager exciso.  Notar lquido ou sangue saindo do local da exciso.  O local da exciso estiver quente ao toque.  Houver pus ou mau cheiro saindo do local da exciso.  Tiver febre.  Sentir dor e no melhorar em 2-3 dias aps o procedimento.  Notar irregularidades na pele ou alteraes na sensao (sensibilidade). Resumo  Este folheto de instrues fornece informaes gerais sobre como cuidar de si aps o procedimento. Entre em contato com seu mdico caso tenha qualquer problema  ou dvida.  Tome medicamentos vendidos com ou sem receita mdica somente de acordo com as indicaes do seu mdico.  Troque o curativo como indicado pelo seu mdico.  Entre em contato com um mdico caso apresente vermelhido, inchao, dor ou outros sinais de infeco em torno do Product manager exciso.  Comparea a todas as consultas de acompanhamento de acordo com as orientaes do seu mdico. Isso  importante. Estas informaes no se destinam a  substituir as recomendaes de seu mdico. No deixe de discutir quaisquer dvidas com seu mdico. Document Revised: 02/09/2018 Document Reviewed: 02/09/2018 Elsevier Patient Education  2020 [employer].

## 2020-05-22 ENCOUNTER — Other Ambulatory Visit: Payer: Self-pay | Admitting: Dentistry

## 2020-05-23 ENCOUNTER — Encounter: Payer: Self-pay | Admitting: Surgery

## 2020-06-17 ENCOUNTER — Telehealth: Payer: Self-pay | Admitting: Surgery

## 2020-06-17 NOTE — Telephone Encounter (Signed)
Incoming call from son, who speaks [place] well.  His dad had sebaceous cyst procedure done on 05/09/20 with Dr. Aleen Campi.  Apparently there is some stitches still that are poking out and bothering him with his clothing.  Please call, thank you.

## 2020-06-17 NOTE — Telephone Encounter (Signed)
Message left for the patient to call back. He may be seen by Dr Aleen Campi tomorrow afternoon.

## 2020-06-18 ENCOUNTER — Other Ambulatory Visit: Payer: Self-pay

## 2020-06-18 ENCOUNTER — Ambulatory Visit (INDEPENDENT_AMBULATORY_CARE_PROVIDER_SITE_OTHER): Payer: Self-pay | Admitting: Surgery

## 2020-06-18 ENCOUNTER — Encounter: Payer: Self-pay | Admitting: Surgery

## 2020-06-18 VITALS — BP 138/85 | HR 92 | Temp 98.6°F | Ht 72.0 in | Wt 234.0 lb

## 2020-06-18 DIAGNOSIS — Z189 Retained foreign body fragments, unspecified material: Secondary | ICD-10-CM

## 2020-06-18 DIAGNOSIS — T8189XA Other complications of procedures, not elsewhere classified, initial encounter: Secondary | ICD-10-CM

## 2020-06-18 NOTE — Telephone Encounter (Signed)
Outgoing call is made again, for patient to call the office so that we can set up an appointment for him to see Dr. Aleen Campi.

## 2020-06-18 NOTE — Patient Instructions (Addendum)
Dr Aleen Campi removed part of suture at today's visit for patient. Patient may apply Neosporin or Triple Antibiotic ointment to the wound before applying the gauze to the wound.  Cuidado de las heridas, en adultos Wound Care, Adult El cuidado correcto de las heridas puede ayudar a Psychologist, sport and exercise y a Radio producer las infecciones y formacin de cicatrices. Adems, puede ayudar a que la cicatrizacin sea ms rpida. Cmo cuidar la herida Cuidados de la herida      Siga las instrucciones del mdico acerca del cuidado de la herida. Asegrese de hacer lo siguiente: ? Lvese las manos con agua y jabn antes de cambiar la venda (vendaje). Use desinfectante para manos si no dispone de [place] y [place]. ? Cambie el vendaje como se lo haya indicado el mdico. ? No retire los puntos (suturas), la goma para cerrar la piel o las tiras [place]. Es posible que estos cierres cutneos deban quedar puestos en la piel durante 2semanas o ms tiempo. Si los bordes de las tiras [address] empiezan a despegarse y Scientific laboratory technician, puede recortar los que estn sueltos. No retire las tiras [employer] por completo a menos que el mdico se lo indique.  Controle la zona de la herida todos los [address]  [address] para detectar signos de infeccin. Est atento a los siguientes signos: ? Enrojecimiento, hinchazn o dolor. ? Lquido o sangre. ? Calor. ? Pus o mal olor.  Pregntele al mdico si debe limpiar la herida con agua y [place]. Hacer esto puede incluir lo siguiente: ? Usar una toalla limpia para secar la herida dando palmaditas despus de limpiarla. No se frote ni restriegue la herida. ? Aplicar una crema o un ungento. Hgalo nicamente como se lo haya indicado el mdico. ? Cubrir la incisin con un vendaje limpio.  Pregntele al mdico cundo puede dejar la herida al descubierto.  Mantenga el vendaje seco hasta que el mdico le diga que se lo puede quitar. No tome baos de inmersin, nade, use el jacuzzi ni haga ninguna actividad en  la que sumerja la herida en agua hasta que el mdico lo autorice. Pregntele al mdico si puede ducharse. Delle Reining solo le permitan darse baos de [place]. Medicamentos   Si le recetaron un antibitico, una crema o un ungento con antibitico, tmelo o aplqueselo como se lo haya indicado el mdico. No deje de tomar o de usar el antibitico, aunque la afeccin mejore.  Tome los medicamentos de venta libre y los recetados solamente como se lo haya indicado el mdico. Si le recetaron un analgsico, tmelo al menos antes de Education officer, environmental el cuidado de la herida, Public librarian se lo haya indicado el mdico. Instrucciones generales  Reanude sus actividades normales segn lo indicado por el mdico. Pregntele al mdico qu actividades son seguras.  No se rasque ni se toque la herida.  No consuma ningn producto que contenga nicotina o tabaco, como cigarrillos y Administrator, Civil Service. El consumo de esos productos puede demorar la cicatrizacin de la herida. Si necesita ayuda para dejar de fumar, consulte al mdico.  Concurra a todas las visitas de seguimiento como se lo haya indicado el mdico. Esto es importante.  Siga una dieta que incluya protenas, vitaminas A y C y otros alimentos ricos en nutrientes que ayudarn a que la herida cicatrice. ? Los alimentos ricos en protenas incluyen carne, productos lcteos, frijoles y nueces, entre otras fuentes de protena. ? Los alimentos ricos en vitaminaA incluyen zanahorias y verduras de hoja verde oscuro. ? Los alimentos ricos en vitaminaC incluyen  ctricos, tomates y otras frutas y verduras. ? Los alimentos ricos en nutrientes tienen protenas, carbohidratos, grasas, vitaminas o minerales. Consuma una variedad de alimentos saludables, que incluya frutas, verduras y Radiation protection practitioner. Comunquese con un mdico si:  Le aplicaron la antitetnica y tiene hinchazn, dolor intenso, enrojecimiento o hemorragia en el sitio de la inyeccin.  El dolor no se  alivia con los [employer].  Tiene enrojecimiento, hinchazn o dolor alrededor de la herida.  Observa lquido o sangre que sale de la herida.  La herida se siente caliente al tacto.  Tiene pus o percibe mal olor que emana de la herida.  Tiene fiebre o siente escalofros.  Siente nuseas o vomita.  Tiene mareos. Solicite ayuda de inmediato si:  Tiene una lnea roja que sale de la herida.  Los bordes de la herida se abren y se separan.  La herida sangra y el sangrado no se detiene con una presin [place].  Tiene una erupcin cutnea.  Se desmaya.  Tiene dificultad para respirar. Resumen  Siempre lvese las manos con agua y jabn antes de cambiar la venda (vendaje).  Para ayudar con la cicatrizacin, coma alimentos ricos en protenas, vitaminas A y C y dems nutrientes.  Controle la herida [employer] para detectar signos de infeccin. Comunquese con el mdico si sospecha que la herida est infectada. Esta informacin no tiene Theme park manager el consejo del mdico. Asegrese de hacerle al mdico cualquier pregunta que tenga. Document Revised: 10/17/2017 Document Reviewed: 01/20/2016 Elsevier Patient Education  2020 [employer].

## 2020-06-18 NOTE — Progress Notes (Signed)
06/18/2020  HPI: Seth Harris is a 44 y.o. male s/p excision of the right anterior chest sebaceous cyst on 05/09/2020.  Comes today to the office because he feels that there is a suture poking through a corner of the wound.  Patient also reports that on the outer corner of the wound, there is irritation and a sore when he wears it tighter undershirt.  Vital signs: BP 138/85   Pulse 92   Temp 98.6 F (37 C) (Oral)   Ht 6' (1.829 m)   Wt 234 lb (106.1 kg)   SpO2 96%   BMI 31.74 kg/m    Physical Exam: Constitutional: No acute distress Skin: Status post excision of right anterior chest wall sebaceous cyst.  Incision is well-healed.  On the lateral corner there is a small portion of  Monocryl suture poking through which was excised with no complications.  On the medial corner, there is a 2 mm cyst that is very superficial which was entered with just simple manipulation of the skin and drain.  This was covered with dry gauze dressing.  Assessment/Plan: This is a 44 y.o. male s/p excision of right anterior chest wall sebaceous cyst.  -Lateral suture remnant was excised with no complication.  Medial portion of the wound was opened with no issues.  This revealed a 1 mm opening in the skin.  This will heal well with just dry gauze dressing.  Patient may use Neosporin if he wishes. -Follow-up as needed   Howie Ill, MD [facility]

## 2020-06-19 NOTE — Telephone Encounter (Signed)
Outgoing call is made again, several messages left already for patient to call the office and make an appointment.

## 2020-09-29 ENCOUNTER — Other Ambulatory Visit: Payer: Self-pay

## 2020-09-29 ENCOUNTER — Ambulatory Visit: Payer: Self-pay | Attending: Family Medicine

## 2020-10-27 ENCOUNTER — Ambulatory Visit: Payer: No Typology Code available for payment source | Attending: Family Medicine

## 2020-10-27 ENCOUNTER — Other Ambulatory Visit: Payer: Self-pay

## 2020-11-04 ENCOUNTER — Other Ambulatory Visit: Payer: Self-pay

## 2020-11-04 MED FILL — Chlorhexidine Gluconate Soln 0.12%: OROMUCOSAL | 14 days supply | Qty: 473 | Fill #0 | Status: AC

## 2020-11-25 ENCOUNTER — Other Ambulatory Visit: Payer: Self-pay

## 2020-11-25 ENCOUNTER — Encounter: Payer: Self-pay | Admitting: Family Medicine

## 2020-11-25 ENCOUNTER — Ambulatory Visit: Payer: Self-pay | Attending: Family Medicine | Admitting: Family Medicine

## 2020-11-25 VITALS — BP 134/80 | HR 79 | Ht 72.0 in | Wt 226.1 lb

## 2020-11-25 DIAGNOSIS — I10 Essential (primary) hypertension: Secondary | ICD-10-CM

## 2020-11-25 DIAGNOSIS — Z1211 Encounter for screening for malignant neoplasm of colon: Secondary | ICD-10-CM

## 2020-11-25 DIAGNOSIS — Z131 Encounter for screening for diabetes mellitus: Secondary | ICD-10-CM

## 2020-11-25 NOTE — Progress Notes (Signed)
Not taking Amlodipine.

## 2020-11-25 NOTE — Patient Instructions (Signed)
https://www.nhlbi.nih.gov/files/docs/public/heart/dash_brief.pdf">  Plan de alimentacin DASH DASH Eating Plan DASH es la sigla en ingls de "Enfoques Alimentarios para Detener la Hipertensin". El plan de alimentacin DASH ha demostrado:  Bajar la presin arterial elevada (hipertensin).  Reducir el riesgo de diabetes tipo 2, enfermedad cardaca y accidente cerebrovascular.  Ayudar a perder peso. Consejos para seguir este plan Leer las etiquetas de los alimentos  Verifique la cantidad de sal (sodio) por porcin en las etiquetas de los alimentos. Elija alimentos con menos del 5 por ciento del valor diario de sodio. Generalmente, los alimentos con menos de 300 miligramos (mg) de sodio por porcin se encuadran dentro de este plan alimentario.  Para encontrar cereales integrales, busque la palabra "integral" como primera palabra en la lista de ingredientes. Al ir de compras  Compre productos en los que en su etiqueta diga: "bajo contenido de sodio" o "sin agregado de sal".  Compre alimentos frescos. Evite los alimentos enlatados y comidas precocidas o congeladas. Al cocinar  Evite agregar sal cuando cocine. Use hierbas o aderezos sin sal, en lugar de sal de mesa o sal marina. Consulte al mdico o farmacutico antes de usar sustitutos de la sal.  No fra los alimentos. A la hora de cocinar los alimentos opte por hornearlos, hervirlos, grillarlos, asarlos al horno y asarlos a la parrilla.  Cocine con aceites cardiosaludables, como oliva, canola, aguacate, soja o girasol. Planificacin de las comidas  Consuma una dieta equilibrada, que incluya lo siguiente: ? 4o ms porciones de frutas y 4 o ms porciones de verduras por da. Trate de que medio plato de cada comida sea de frutas y verduras. ? De 6 a 8porciones de cereales integrales todos los das. ? Menos de 6 onzas (170g) de carne, aves o pescado magros por da. Una porcin de 3 onzas (85g) de carne tiene casi el mismo tamao que un  mazo de cartas. Un huevo equivale a 1 onza (28g). ? De 2 a 3 porciones de productos lcteos descremados por da. Una porcin es 1taza (237ml). ? 1 porcin de frutos secos, semillas o frijoles 5 veces por semana. ? De 2 a 3 porciones de grasas cardiosaludables. Las grasas saludables llamadas cidos grasos omega-3 se encuentran en alimentos como las nueces, las semillas de lino, las leches fortificadas y los huevos. Estas grasas tambin se encuentran en los pescados de agua fra, como la sardina, el salmn y la caballa.  Limite la cantidad que consume de: ? Alimentos enlatados o envasados. ? Alimentos con alto contenido de grasa trans, como algunos alimentos fritos. ? Alimentos con alto contenido de grasa saturada, como carne con grasa. ? Postres y otros dulces, bebidas azucaradas y otros alimentos con azcar agregada. ? Productos lcteos enteros.  No le agregue sal a los alimentos antes de probarlos.  No coma ms de 4 yemas de huevo por semana.  Trate de comer al menos 2 comidas vegetarianas por semana.  Consuma ms comida casera y menos de restaurante, de bares y comida rpida.   Estilo de vida  Cuando coma en un restaurante, pida que preparen su comida con menos sal o, en lo posible, sin nada de sal.  Si bebe alcohol: ? Limite la cantidad que bebe:  De 0 a 1 medida por da para las mujeres que no estn embarazadas.  De 0 a 2 medidas por da para los hombres. ? Est atento a la cantidad de alcohol que hay en las bebidas que toma. En los [place], una medida equivale a una botella   de cerveza de 12oz (355ml), un vaso de vino de 5oz (148ml) o un vaso de una bebida alcohlica de alta graduacin de 1oz (44ml). Informacin general  Evite ingerir ms de 2300 mg de sal por da. Si tiene hipertensin, es posible que necesite reducir la ingesta de sodio a 1,500 mg por da.  Trabaje con su mdico para mantener un peso saludable o perder peso. Pregntele cul es el peso  recomendado para usted.  Realice al menos 30 minutos de ejercicio que haga que se acelere su corazn (ejercicio aerbico) la mayora de los das de la semana. Estas actividades pueden incluir caminar, nadar o andar en bicicleta.  Trabaje con su mdico o nutricionista para ajustar su plan alimentario a sus necesidades calricas personales. Qu alimentos debo comer? Frutas Todas las frutas frescas, congeladas o disecadas. Frutas enlatadas en jugo natural (sin agregado de azcar). Verduras Verduras frescas o congeladas (crudas, al vapor, asadas o grilladas). Jugos de tomate y verduras con bajo contenido de sodio o reducidos en sodio. Salsa y pasta de tomate con bajo contenido de sodio o reducidas en sodio. Verduras enlatadas con bajo contenido de sodio o reducidas en sodio. Granos Pan de salvado o integral. Pasta de salvado o integral. Arroz integral. Avena. Quinua. Trigo burgol. Cereales integrales y con bajo contenido de sodio. Pan pita. Galletitas de agua con bajo contenido de grasa y sodio. Tortillas de harina integral. Carnes y otras protenas Pollo o pavo sin piel. Carne de pollo o de pavo molida. Cerdo desgrasado. Pescado y mariscos. Claras de huevo. Porotos, guisantes o lentejas secos. Frutos secos, mantequilla de frutos secos y semillas sin sal. Frijoles enlatados sin sal. Cortes de carne vacuna magra, desgrasada. Carne precocida o curada magra y baja en sodio, como embutidos o panes de carne. Lcteos Leche descremada (1%) o descremada. Quesos reducidos en grasa, con bajo contenido de grasa o descremados. Queso blanco o ricota sin grasa, con bajo contenido de sodio. Yogur semidescremado o descremado. Queso con bajo contenido de grasa y sodio. Grasas y aceites Margarinas untables que no contengan grasas trans. Aceite vegetal. Mayonesa y aderezos para ensaladas livianos, reducidos en grasa o con bajo contenido de grasas (reducidos en sodio). Aceite de canola, crtamo, oliva, aguacate, soja y  girasol. Aguacate. Alios y condimentos Hierbas. Especias. Mezclas de condimentos sin sal. Otros alimentos Palomitas de maz y pretzels sin sal. Dulces con bajo contenido de grasas. Es posible que los productos que se enumeran ms arriba no constituyan una lista completa de los alimentos y las bebidas que puede tomar. Consulte a un nutricionista para obtener ms informacin. Qu alimentos debo evitar? Frutas Fruta enlatada en almbar liviano o espeso. Frutas cocidas en aceite. Frutas con salsa de crema o mantequilla. Verduras Verduras con crema o fritas. Verduras en salsa de queso. Verduras enlatadas regulares (que no sean con bajo contenido de sodio o reducidas en sodio). Pasta y salsa de tomates enlatadas regulares (que no sean con bajo contenido de sodio o reducidas en sodio). Jugos de tomate y verduras regulares (que no sean con bajo contenido de sodio o reducidos en sodio). Pepinillos. Aceitunas. Granos Productos de panificacin hechos con grasa, como medialunas, magdalenas y algunos panes. Comidas con arroz o pasta seca listas para usar. Carnes y otras protenas Cortes de carne con alto contenido de grasa. Costillas. Carne frita. [place]ocino. [place], salame y otras carnes precocidas o curadas, como embutidos o panes de carne. Grasa de la espalda del cerdo (panceta). Salchicha de cerdo. Frutos secos y semillas con sal. Frijoles   enlatados con agregado de sal. Pescado enlatado o ahumado. Huevos enteros o yemas. Pollo o pavo con piel. Lcteos Leche entera o al 2%, crema y mitad leche y mitad crema. Queso crema entero o con toda su grasa. Yogur entero o endulzado. Quesos con toda su grasa. Sustitutos de cremas no lcteas. Coberturas batidas. Quesos para untar y quesos procesados. Grasas y aceites Mantequilla. Margarina en barra. Manteca de cerdo. Lardo. Mantequilla clarificada. Grasa de panceta. Aceites tropicales como aceite de coco, palmiste o palma. Alios y condimentos Sal de cebolla, sal de  ajo, sal condimentada, sal de mesa y sal marina. Salsa [place]. Salsa trtara. Salsa barbacoa. Salsa teriyaki. Salsa de soja, incluso la que tiene contenido reducido de sodio. Salsa de carne. Salsas en lata y envasadas. Salsa de pescado. Salsa de ostras. Salsa rosada. Rbanos picantes comprados en tiendas. [place]. [place]aza. Saborizantes y tiernizantes para carne. Caldo en cubitos. Salsas picantes. Adobos preelaborados o envasados. Aderezos para tacos preelaborados o envasados. Salsas de pepinillos. Aderezos comunes para ensalada. Otros alimentos Palomitas de maz y pretzels con sal. Es posible que los productos que se enumeran ms arriba no constituyan una lista completa de los alimentos y las bebidas que debe evitar. Consulte a un nutricionista para obtener ms informacin. Dnde buscar ms informacin  National Heart, Lung, and Blood Institute (Instituto Nacional del Corazn, los Pulmones y la Sangre): www.nhlbi.nih.gov  American Heart Association (Asociacin Estadounidense del Corazn): www.heart.org  Academy of Nutrition and Dietetics (Academia de Nutricin y Diettica): www.eatright.org  National Kidney Foundation (Fundacin Nacional del Rin): www.kidney.org Resumen  El plan de alimentacin DASH ha demostrado bajar la presin arterial elevada (hipertensin). Tambin puede reducir el riesgo de diabetes tipo 2, enfermedad cardaca y accidente cerebrovascular.  Cuando siga el plan de alimentacin DASH, trate de comer ms frutas frescas y verduras, cereales integrales, carnes magras, lcteos descremados y grasas cardiosaludables.  Con el plan de alimentacin DASH, deber limitar el consumo de sal (sodio) a 2,300 mg por da. Si tiene hipertensin, es posible que necesite reducir la ingesta de sodio a 1,500 mg por da.  Trabaje con su mdico o nutricionista para ajustar su plan alimentario a sus necesidades calricas personales. Esta informacin no tiene como fin reemplazar el consejo  del mdico. Asegrese de hacerle al mdico cualquier pregunta que tenga. Document Revised: 08/09/2019 Document Reviewed: 08/09/2019 Elsevier Patient Education  2021 Elsevier Inc.  

## 2020-11-25 NOTE — Progress Notes (Signed)
Subjective:  Patient ID: Seth Harris, male    DOB: 1976-02-22  Age: 45 y.o. MRN: [id number]  CC: Hypertension   HPI Seth Harris Seth Harris is a 45 year old male with a history of hypertension who presents today for follow-up visit.  He has not been taking his Amlodpine as he states he has been feeling fine. His BP today is normal and a couple of times at home his blood pressure has been normal. He does not exercise regularly. Denies presence of chest pain or dyspnea.  Past Medical History:  Diagnosis Date  . Allergy   . Environmental allergies   . Hypertension     Past Surgical History:  Procedure Laterality Date  . HERNIA REPAIR      Family History  Problem Relation Age of Onset  . Diabetes Mother   . Diabetes Brother     No Known Allergies  Outpatient Medications Prior to Visit  Medication Sig Dispense Refill  . cetirizine (ZYRTEC) 10 MG tablet Take 1 tablet (10 mg total) by mouth daily. 30 tablet 1  . chlorhexidine (PERIDEX) 0.12 % solution SMARTSIG:By Mouth    . chlorhexidine (PERIDEX) 0.12 % solution LLENE LA TAPA HASTA 1A LINEA DE LLENADO Y ENJUAGUE DURANTE 1 MIN DOS VECES AL DIA DESPUES DE CEPILLADO HASTA QUE SE ACABE. 473 mL 3  . cyclobenzaprine (FLEXERIL) 5 MG tablet Take 1-2 tablets (5-10 mg total) by mouth 2 (two) times daily as needed for muscle spasms. 24 tablet 0  . naproxen (NAPROSYN) 500 MG tablet Take 1 tablet (500 mg total) by mouth 2 (two) times daily with a meal. 60 tablet 0  . amLODipine (NORVASC) 2.5 MG tablet Take 1 tablet (2.5 mg total) by mouth daily. (Patient not taking: Reported on 11/25/2020) 90 tablet 1  . amLODipine (NORVASC) 2.5 MG tablet TAKE 1 TABLET (2.5 MG TOTAL) BY MOUTH DAILY. (Patient not taking: Reported on 11/25/2020) 90 tablet 1   No facility-administered medications prior to visit.     ROS Review of Systems  Constitutional: Negative for activity change and appetite change.  HENT: Negative for  sinus pressure and sore throat.   Eyes: Negative for visual disturbance.  Respiratory: Negative for cough, chest tightness and shortness of breath.   Cardiovascular: Negative for chest pain and leg swelling.  Gastrointestinal: Negative for abdominal distention, abdominal pain, constipation and diarrhea.  Endocrine: Negative.   Genitourinary: Negative for dysuria.  Musculoskeletal: Negative for joint swelling and myalgias.  Skin: Negative for rash.  Allergic/Immunologic: Negative.   Neurological: Negative for weakness, light-headedness and numbness.  Psychiatric/Behavioral: Negative for dysphoric mood and suicidal ideas.    Objective:  BP 134/80   Pulse 79   Ht 6' (1.829 m)   Wt 226 lb 1.3 oz (102.5 kg)   SpO2 99%   BMI 30.66 kg/m   BP/Weight 11/25/2020 06/18/2020 05/09/2020  Systolic BP 134 138 145  Diastolic BP 80 85 78  Wt. (Lbs) 226.08 234 228  BMI 30.66 31.74 30.92      Physical Exam Constitutional:      Appearance: He is well-developed.  Neck:     Vascular: No JVD.  Cardiovascular:     Rate and Rhythm: Normal rate.     Heart sounds: Normal heart sounds. No murmur heard.   Pulmonary:     Effort: Pulmonary effort is normal.     Breath sounds: Normal breath sounds. No wheezing or rales.  Chest:     Chest wall: No tenderness.  Abdominal:  General: Bowel sounds are normal. There is no distension.     Palpations: Abdomen is soft. There is no mass.     Tenderness: There is no abdominal tenderness.  Musculoskeletal:        General: Normal range of motion.     Right lower leg: No edema.     Left lower leg: No edema.  Neurological:     Mental Status: He is alert and oriented to person, place, and time.  Psychiatric:        Mood and Affect: Mood normal.     CMP Latest Ref Rng & Units 04/23/2020 06/18/2019 01/18/2019  Glucose 65 - 99 mg/dL 270(J) 500(X) 98  BUN 6 - 24 mg/dL 15 14 16   Creatinine 0.76 - 1.27 mg/dL 3.81 8.29  Sodium 134 - 144 mmol/L 140 140  137  Potassium 3.5 - 5.2 mmol/L 4.4 4.4 3.9  Chloride 96 - 106 mmol/L 102 102 102  CO2 20 - 29 mmol/L 25 23 21   Calcium 8.7 - 10.2 mg/dL 9.4 9.6 9.1  Total Protein 6.0 - 8.5 g/dL 7.6 7.7 7.2  Total Bilirubin 0.0 - 1.2 mg/dL 0.6 0.4 0.4  Alkaline Phos 44 - 121 IU/L 65 64 63  AST 0 - 40 IU/L 26 26 27   ALT 0 - 44 IU/L 33 35 33    Lipid Panel     Component Value Date/Time   CHOL 163 04/23/2020 0838   TRIG 209 (H) 04/23/2020 0838   HDL 33 (L) 04/23/2020 0838   CHOLHDL 4.9 04/23/2020 0838   CHOLHDL 3.4 08/26/2016 0910   VLDL 27 08/31/2015 0955   LDLCALC 94 04/23/2020 0838    CBC    Component Value Date/Time   WBC 5.7 04/23/2020 0838   WBC 4.6 08/26/2016 0910   RBC 5.66 04/23/2020 0838   RBC 5.55 08/26/2016 0910   HGB 17.1 04/23/2020 0838   HCT 50.6 04/23/2020 0838   PLT 167 04/23/2020 0838   MCV 89 04/23/2020 0838   MCH 30.2 04/23/2020 0838   MCH 30.1 08/26/2016 0910   MCHC 33.8 04/23/2020 0838   MCHC 34.1 08/26/2016 0910   RDW 12.4 04/23/2020 0838   LYMPHSABS 2.1 04/23/2020 0838   MONOABS 276 08/26/2016 0910   EOSABS 0.1 04/23/2020 0838   BASOSABS 0.1 04/23/2020 0838    Lab Results  Component Value Date   HGBA1C 5.1 09/23/2015    Assessment & Plan:  1. Essential hypertension Diet controlled Advised he can discontinue amlodipine and work on lifestyle modifications Counseled on blood pressure goal of less than 130/80, low-sodium, DASH diet, medication compliance, 150 minutes of moderate intensity exercise per week. Discussed medication compliance, adverse effects. - Basic Metabolic Panel  2. Screening for colon cancer - Fecal occult blood, imunochemical([facility])  3. Screening for diabetes mellitus - Hemoglobin A1c    No orders of the defined types were placed in this encounter.   Follow-up: Return in about 6 months (around 05/28/2021) for chronic disease managemnt.       06/23/2020, MD, Harris. [facility]  Seth Harris, Seth Harris Seth Harris   11/25/2020, 4:54 PM

## 2020-11-26 LAB — HEMOGLOBIN A1C
Est. average glucose Bld gHb Est-mCnc: 114 mg/dL
Hgb A1c MFr Bld: 5.6 % (ref 4.8–5.6)

## 2020-11-26 LAB — BASIC METABOLIC PANEL
BUN/Creatinine Ratio: 13 (ref 9–20)
BUN: 11 mg/dL (ref 6–24)
CO2: 24 mmol/L (ref 20–29)
Calcium: 10 mg/dL (ref 8.7–10.2)
Chloride: 102 mmol/L (ref 96–106)
Creatinine, Ser: 0.83 mg/dL (ref 0.76–1.27)
Glucose: 88 mg/dL (ref 65–99)
Potassium: 4.3 mmol/L (ref 3.5–5.2)
Sodium: 140 mmol/L (ref 134–144)
eGFR: 109 mL/min/{1.73_m2} (ref >59)

## 2020-11-28 ENCOUNTER — Telehealth: Payer: Self-pay

## 2020-11-28 NOTE — Telephone Encounter (Signed)
Normal results letter will be mailed to patient.

## 2020-11-28 NOTE — Telephone Encounter (Signed)
-----   Message from Enobong Newlin, MD sent at 11/26/2020  4:48 PM EDT ----- Please inform the patient that labs are normal. Thank you. 

## 2020-12-04 LAB — FECAL OCCULT BLOOD, IMMUNOCHEMICAL: Fecal Occult Bld: NEGATIVE

## 2020-12-05 ENCOUNTER — Telehealth: Payer: Self-pay

## 2020-12-05 NOTE — Telephone Encounter (Signed)
-----   Message from Hoy Register, MD sent at 12/04/2020  8:19 AM EDT ----- Please inform him that his stool test for colon cancer screening is negative.

## 2020-12-05 NOTE — Telephone Encounter (Signed)
Normal results letter has been mailed.

## 2020-12-17 ENCOUNTER — Other Ambulatory Visit: Payer: Self-pay

## 2020-12-17 MED FILL — Amlodipine Besylate Tab 2.5 MG (Base Equivalent): ORAL | 30 days supply | Qty: 30 | Fill #0 | Status: AC

## 2020-12-18 ENCOUNTER — Other Ambulatory Visit: Payer: Self-pay

## 2021-01-20 ENCOUNTER — Other Ambulatory Visit: Payer: Self-pay

## 2021-01-20 MED FILL — Amlodipine Besylate Tab 2.5 MG (Base Equivalent): ORAL | 30 days supply | Qty: 30 | Fill #1 | Status: AC

## 2021-01-22 ENCOUNTER — Other Ambulatory Visit: Payer: Self-pay

## 2021-03-24 ENCOUNTER — Other Ambulatory Visit: Payer: Self-pay

## 2021-03-24 MED FILL — Amlodipine Besylate Tab 2.5 MG (Base Equivalent): ORAL | 30 days supply | Qty: 30 | Fill #2 | Status: AC

## 2021-03-31 ENCOUNTER — Ambulatory Visit: Payer: No Typology Code available for payment source

## 2021-04-29 ENCOUNTER — Other Ambulatory Visit: Payer: Self-pay

## 2021-04-29 ENCOUNTER — Ambulatory Visit: Payer: Self-pay | Attending: Family Medicine

## 2021-05-21 ENCOUNTER — Other Ambulatory Visit: Payer: Self-pay

## 2021-05-21 MED ORDER — AMOXICILLIN 500 MG PO CAPS
ORAL_CAPSULE | ORAL | 0 refills | Status: DC
  Filled 2021-05-21: qty 30, 10d supply, fill #0

## 2021-05-28 ENCOUNTER — Other Ambulatory Visit: Payer: Self-pay

## 2021-05-28 ENCOUNTER — Encounter: Payer: Self-pay | Admitting: Family Medicine

## 2021-05-28 ENCOUNTER — Ambulatory Visit: Payer: Self-pay | Attending: Family Medicine | Admitting: Family Medicine

## 2021-05-28 VITALS — BP 143/84 | HR 63 | Ht 78.0 in | Wt 234.8 lb

## 2021-05-28 DIAGNOSIS — M545 Low back pain, unspecified: Secondary | ICD-10-CM

## 2021-05-28 DIAGNOSIS — I10 Essential (primary) hypertension: Secondary | ICD-10-CM

## 2021-05-28 MED ORDER — AMLODIPINE BESYLATE 2.5 MG PO TABS
2.5000 mg | ORAL_TABLET | Freq: Every day | ORAL | 1 refills | Status: DC
  Filled 2021-05-28: qty 30, 30d supply, fill #0
  Filled 2021-06-05 – 2021-09-16 (×2): qty 90, 90d supply, fill #0

## 2021-05-28 NOTE — Patient Instructions (Addendum)

## 2021-05-28 NOTE — Progress Notes (Signed)
Subjective:  Patient ID: Seth Harris, male    DOB: August 17, 1975  Age: 45 y.o. MRN: [id number]  CC: Hypertension   HPI [facility] [name] is a 45 y.o. year old male with a history of Hypertension here for chronic disease management. At his last visit he was reluctant to take medications and so after shared decision making we ageed his Hypertension would be diet controlled.  Interval History: BP at home have been in the 128 /70 range. He states he has been taking Amlodipine as needed. His lower back has been hurting more when he bends over the last 3 weeks but now pain has subsided.  He does a lot of manual labor.  Past Medical History:  Diagnosis Date   Allergy    Environmental allergies    Hypertension     Past Surgical History:  Procedure Laterality Date   HERNIA REPAIR      Family History  Problem Relation Age of Onset   Diabetes Mother    Diabetes Brother     No Known Allergies  Outpatient Medications Prior to Visit  Medication Sig Dispense Refill   amLODipine (NORVASC) 2.5 MG tablet Take 1 tablet (2.5 mg total) by mouth daily. 90 tablet 1   amoxicillin (AMOXIL) 500 MG capsule Take 1 capsule by mouth 3 times daily until gone. (Patient not taking: Reported on 05/28/2021) 30 capsule 0   cetirizine (ZYRTEC) 10 MG tablet Take 1 tablet (10 mg total) by mouth daily. (Patient not taking: Reported on 05/28/2021) 30 tablet 1   chlorhexidine (PERIDEX) 0.12 % solution SMARTSIG:By Mouth (Patient not taking: Reported on 05/28/2021)     cyclobenzaprine (FLEXERIL) 5 MG tablet Take 1-2 tablets (5-10 mg total) by mouth 2 (two) times daily as needed for muscle spasms. (Patient not taking: Reported on 05/28/2021) 24 tablet 0   naproxen (NAPROSYN) 500 MG tablet Take 1 tablet (500 mg total) by mouth 2 (two) times daily with a meal. (Patient not taking: Reported on 05/28/2021) 60 tablet 0   amLODipine (NORVASC) 2.5 MG tablet TAKE 1 TABLET (2.5 MG TOTAL) BY  MOUTH DAILY. (Patient not taking: Reported on 11/25/2020) 90 tablet 1   No facility-administered medications prior to visit.     ROS Review of Systems  Constitutional:  Negative for activity change and appetite change.  HENT:  Negative for sinus pressure and sore throat.   Eyes:  Negative for visual disturbance.  Respiratory:  Negative for cough, chest tightness and shortness of breath.   Cardiovascular:  Negative for chest pain and leg swelling.  Gastrointestinal:  Negative for abdominal distention, abdominal pain, constipation and diarrhea.  Endocrine: Negative.   Genitourinary:  Negative for dysuria.  Musculoskeletal:  Positive for back pain. Negative for joint swelling and myalgias.  Skin:  Negative for rash.  Allergic/Immunologic: Negative.   Neurological:  Negative for weakness, light-headedness and numbness.  Psychiatric/Behavioral:  Negative for dysphoric mood and suicidal ideas.    Objective:  BP (!) 143/84   Pulse 63   Ht _0  (1.981 m)   Wt 234 lb 12.8 oz (106.5 kg)   SpO2 99%   BMI 27.13 kg/m   BP/Weight 05/28/2021 11/25/2020 89/08/1192  Systolic BP 174 081 448  Diastolic BP 84 80 85  Wt. (Lbs) 234.8 226.08 234  BMI 27.13 30.66 31.74      Physical Exam Constitutional:      Appearance: He is well-developed.  Cardiovascular:     Rate and Rhythm: Normal rate.  Heart sounds: Normal heart sounds. No murmur heard. Pulmonary:     Effort: Pulmonary effort is normal.     Breath sounds: Normal breath sounds. No wheezing or rales.  Chest:     Chest wall: No tenderness.  Abdominal:     General: Bowel sounds are normal. There is no distension.     Palpations: Abdomen is soft. There is no mass.     Tenderness: There is no abdominal tenderness.  Musculoskeletal:        General: Normal range of motion.     Right lower leg: No edema.     Left lower leg: No edema.     Comments: Negative straight leg raise bilaterally No tenderness on palpation of entire spine  and thoracolumbar muscles  Neurological:     Mental Status: He is alert and oriented to person, place, and time.  Psychiatric:        Mood and Affect: Mood normal.    CMP Latest Ref Rng & Units 11/25/2020 04/23/2020 06/18/2019  Glucose 65 - 99 mg/dL 88 113(H) 109(H)  BUN 6 - 24 mg/dL _0 Creatinine 0.76 - 1.27 mg/dL 0.83 0.95 0.92  Sodium 134 - 144 mmol/L 140 140 140  Potassium 3.5 - 5.2 mmol/L 4.3 4.4 4.4  Chloride 96 - 106 mmol/L 102 102 102  CO2 20 - 29 mmol/L _1 Calcium 8.7 - 10.2 mg/dL 10.0 9.4 9.6  Total Protein 6.0 - 8.5 g/dL - 7.6 7.7  Total Bilirubin 0.0 - 1.2 mg/dL - 0.6 0.4  Alkaline Phos 44 - 121 IU/L - 65 64  AST 0 - 40 IU/L - 26 26  ALT 0 - 44 IU/L - 33 35    Lipid Panel     Component Value Date/Time   CHOL 163 04/23/2020 0838   TRIG 209 (H) 04/23/2020 0838   HDL 33 (L) 04/23/2020 0838   CHOLHDL 4.9 04/23/2020 0838   CHOLHDL 3.4 08/26/2016 0910   VLDL 27 08/31/2015 0955   LDLCALC 94 04/23/2020 0838    CBC    Component Value Date/Time   WBC 5.7 04/23/2020 0838   WBC 4.6 08/26/2016 0910   RBC 5.66 04/23/2020 0838   RBC 5.55 08/26/2016 0910   HGB 17.1 04/23/2020 0838   HCT 50.6 04/23/2020 0838   PLT 167 04/23/2020 0838   MCV 89 04/23/2020 0838   MCH 30.2 04/23/2020 0838   MCH 30.1 08/26/2016 0910   MCHC 33.8 04/23/2020 0838   MCHC 34.1 08/26/2016 0910   RDW 12.4 04/23/2020 0838   LYMPHSABS 2.1 04/23/2020 0838   MONOABS 276 08/26/2016 0910   EOSABS 0.1 04/23/2020 0838   BASOSABS 0.1 04/23/2020 0838    Lab Results  Component Value Date   HGBA1C 5.6 11/25/2020    Assessment & Plan:  1. Essential hypertension Slightly elevated Advised to resume amlodipine Counseled on blood pressure goal of less than 130/80, low-sodium, DASH diet, medication compliance, 150 minutes of moderate intensity exercise per week. Discussed medication compliance, adverse effects. - LP+Non-HDL Cholesterol - CMP14+EGFR - CBC with Differential/Platelet -  amLODipine (NORVASC) 2.5 MG tablet; Take 1 tablet (2.5 mg total) by mouth daily.  Dispense: 90 tablet; Refill: 1  2. Acute bilateral low back pain without sciatica Musculoskeletal pain Advised to apply heat or ice whichever is tolerated to painful areas. Counseled on evidence of improvement in pain control with regards to yoga, water aerobics, massage, home physical therapy, exercise as tolerated. Advised to use OTC NSAIDs  Meds ordered this encounter  Medications   amLODipine (NORVASC) 2.5 MG tablet    Sig: Take 1 tablet (2.5 mg total) by mouth daily.    Dispense:  90 tablet    Refill:  1     Follow-up: Return in about 6 months (around 11/25/2021) for Chronic medical conditions.       Charlott Rakes, MD, FAAFP. [facility] [place], [place]   05/28/2021, 10:23 AM

## 2021-05-29 ENCOUNTER — Telehealth: Payer: Self-pay

## 2021-05-29 LAB — CMP14+EGFR
ALT: 37 IU/L (ref 0–44)
AST: 26 IU/L (ref 0–40)
Albumin/Globulin Ratio: 1.6 (ref 1.2–2.2)
Albumin: 4.6 g/dL (ref 4.0–5.0)
Alkaline Phosphatase: 68 IU/L (ref 44–121)
BUN/Creatinine Ratio: 12 (ref 9–20)
BUN: 12 mg/dL (ref 6–24)
Bilirubin Total: 0.4 mg/dL (ref 0.0–1.2)
CO2: 25 mmol/L (ref 20–29)
Calcium: 9.2 mg/dL (ref 8.7–10.2)
Chloride: 102 mmol/L (ref 96–106)
Creatinine, Ser: 0.97 mg/dL (ref 0.76–1.27)
Globulin, Total: 2.8 g/dL (ref 1.5–4.5)
Glucose: 109 mg/dL — ABNORMAL HIGH (ref 70–99)
Potassium: 4.4 mmol/L (ref 3.5–5.2)
Sodium: 141 mmol/L (ref 134–144)
Total Protein: 7.4 g/dL (ref 6.0–8.5)
eGFR: 97 mL/min/{1.73_m2} (ref >59)

## 2021-05-29 LAB — CBC WITH DIFFERENTIAL/PLATELET
Basophils Absolute: 0 10*3/uL (ref 0.0–0.2)
Basos: 1 %
EOS (ABSOLUTE): 0.1 10*3/uL (ref 0.0–0.4)
Eos: 2 %
Hematocrit: 50.1 % (ref 37.5–51.0)
Hemoglobin: 17 g/dL (ref 13.0–17.7)
Immature Grans (Abs): 0 10*3/uL (ref 0.0–0.1)
Immature Granulocytes: 1 %
Lymphocytes Absolute: 2.3 10*3/uL (ref 0.7–3.1)
Lymphs: 41 %
MCH: 29.7 pg (ref 26.6–33.0)
MCHC: 33.9 g/dL (ref 31.5–35.7)
MCV: 87 fL (ref 79–97)
Monocytes Absolute: 0.4 10*3/uL (ref 0.1–0.9)
Monocytes: 7 %
Neutrophils Absolute: 2.7 10*3/uL (ref 1.4–7.0)
Neutrophils: 48 %
Platelets: 165 10*3/uL (ref 150–450)
RBC: 5.73 x10E6/uL (ref 4.14–5.80)
RDW: 11.9 % (ref 11.6–15.4)
WBC: 5.6 10*3/uL (ref 3.4–10.8)

## 2021-05-29 LAB — LP+NON-HDL CHOLESTEROL
Cholesterol, Total: 151 mg/dL (ref 100–199)
HDL: 33 mg/dL — ABNORMAL LOW (ref >39)
LDL Chol Calc (NIH): 86 mg/dL (ref 0–99)
Total Non-HDL-Chol (LDL+VLDL): 118 mg/dL (ref 0–129)
Triglycerides: 189 mg/dL — ABNORMAL HIGH (ref 0–149)
VLDL Cholesterol Cal: 32 mg/dL (ref 5–40)

## 2021-05-29 NOTE — Telephone Encounter (Signed)
-----   Message from Enobong Newlin, MD sent at 05/29/2021  8:44 AM EST ----- Please inform the patient that labs are stable. 

## 2021-05-29 NOTE — Telephone Encounter (Signed)
Patient name and DOB has been verified Patient was informed of lab results. Patient had no questions.  

## 2021-06-03 ENCOUNTER — Other Ambulatory Visit: Payer: Self-pay

## 2021-06-04 ENCOUNTER — Other Ambulatory Visit: Payer: Self-pay

## 2021-06-05 ENCOUNTER — Other Ambulatory Visit: Payer: Self-pay

## 2021-09-16 ENCOUNTER — Other Ambulatory Visit: Payer: Self-pay

## 2021-10-13 ENCOUNTER — Other Ambulatory Visit: Payer: Self-pay

## 2021-10-13 ENCOUNTER — Ambulatory Visit: Payer: Self-pay | Attending: Family Medicine | Admitting: Family Medicine

## 2021-10-13 VITALS — BP 144/93 | HR 85 | Ht 72.0 in | Wt 233.6 lb

## 2021-10-13 DIAGNOSIS — I10 Essential (primary) hypertension: Secondary | ICD-10-CM

## 2021-10-13 DIAGNOSIS — L819 Disorder of pigmentation, unspecified: Secondary | ICD-10-CM

## 2021-10-13 NOTE — Patient Instructions (Signed)

## 2021-10-13 NOTE — Progress Notes (Signed)
Spots above eye and left wrist. ?

## 2021-10-13 NOTE — Progress Notes (Signed)
? ?Established Patient Office Visit ? ?Subjective:  ?Patient ID: Seth Harris, male    DOB: June 30, 1976  Age: 46 y.o. MRN: [id number] ? ?CC: "Rash on face" ? ? ?HPI ?[facility] [name] is a 46 year old male with a medical history of hypertension who presents for a rapidly growing nodule on his eyelid.  ? ?Interval History ?Patient describes that the spot on eyelid appeared between 6-8 months ago. It started small and has grown rapidly, it does not usually itch but does itch and is dry sometimes. Patient does spend a lot of time in the sun. He describes that his mom and brother do have similar findings on their skin.  ? ?In terms of his blood pressure, he had beenhad been getting 128/80 or 133/80 while at home and because his blood pressure was going down, he stopped taking amlodipine for a week, as he was worried it would make his BP too low or have side effects that were harmful.  ? ?His low back pain has been better since he has been getting massages.  ? ?Past Medical History:  ?Diagnosis Date  ? Allergy   ? Environmental allergies   ? Hypertension   ? ? ?Past Surgical History:  ?Procedure Laterality Date  ? HERNIA REPAIR    ? ? ?Family History  ?Problem Relation Age of Onset  ? Diabetes Mother   ? Diabetes Brother   ? ? ?Social History  ? ?Socioeconomic History  ? Marital status: Married  ?  Spouse name: Not on file  ? Number of children: Not on file  ? Years of education: Not on file  ? Highest education level: Not on file  ?Occupational History  ? Not on file  ?Tobacco Use  ? Smoking status: Former  ?  Types: Cigarettes  ?  Quit date: 11/09/2001  ?  Years since quitting: 19.9  ? Smokeless tobacco: Never  ?Vaping Use  ? Vaping Use: Never used  ?Substance and Sexual Activity  ? Alcohol use: No  ? Drug use: No  ? Sexual activity: Yes  ?  Birth control/protection: None  ?Other Topics Concern  ? Not on file  ?Social History Narrative  ? Not on file  ? ?Social Determinants of Health   ? ?Financial Resource Strain: Not on file  ?Food Insecurity: Not on file  ?Transportation Needs: Not on file  ?Physical Activity: Not on file  ?Stress: Not on file  ?Social Connections: Not on file  ?Intimate Partner Violence: Not on file  ? ? ?Outpatient Medications Prior to Visit  ?Medication Sig Dispense Refill  ? amLODipine (NORVASC) 2.5 MG tablet Take 1 tablet (2.5 mg total) by mouth daily. 90 tablet 1  ? chlorhexidine (PERIDEX) 0.12 % solution SMARTSIG:By Mouth (Patient not taking: Reported on 05/28/2021)    ? amoxicillin (AMOXIL) 500 MG capsule Take 1 capsule by mouth 3 times daily until gone. (Patient not taking: Reported on 05/28/2021) 30 capsule 0  ? cetirizine (ZYRTEC) 10 MG tablet Take 1 tablet (10 mg total) by mouth daily. (Patient not taking: Reported on 05/28/2021) 30 tablet 1  ? cyclobenzaprine (FLEXERIL) 5 MG tablet Take 1-2 tablets (5-10 mg total) by mouth 2 (two) times daily as needed for muscle spasms. (Patient not taking: Reported on 05/28/2021) 24 tablet 0  ? naproxen (NAPROSYN) 500 MG tablet Take 1 tablet (500 mg total) by mouth 2 (two) times daily with a meal. (Patient not taking: Reported on 05/28/2021) 60 tablet 0  ? ?No  facility-administered medications prior to visit.  ? ? ?No Known Allergies ? ?ROS ?Review of Systems  ?Constitutional:  Negative for fatigue and unexpected weight change.  ?HENT:  Negative for rhinorrhea and sore throat.   ?Respiratory:  Negative for cough and shortness of breath.   ?Cardiovascular:  Negative for chest pain and leg swelling.  ?Gastrointestinal:  Negative for abdominal pain.  ?Musculoskeletal:  Negative for back pain.  ?Neurological:  Negative for weakness and numbness.  ?Psychiatric/Behavioral:  Negative for dysphoric mood. The patient is not nervous/anxious.   ? ?  ?Objective:  ?  ?Physical Exam ?Constitutional:   ?   Appearance: Normal appearance. He is normal weight.  ?HENT:  ?   Head: Normocephalic and atraumatic.  ?   Nose: Nose normal.  ?Eyes:  ?    Extraocular Movements: Extraocular movements intact.  ?   Pupils: Pupils are equal, round, and reactive to light.  ?Cardiovascular:  ?   Rate and Rhythm: Normal rate and regular rhythm.  ?   Pulses: Normal pulses.  ?   Heart sounds: Normal heart sounds.  ?Pulmonary:  ?   Effort: Pulmonary effort is normal.  ?   Breath sounds: Normal breath sounds.  ?Abdominal:  ?   General: Bowel sounds are normal.  ?   Palpations: Abdomen is soft.  ?Skin: ?   Comments: Left upper eyelid, hyperpigmented nodule, diameter of >0.67m ? ?Left inner forearm, hyperpigmented flat papule  ?Neurological:  ?   Mental Status: He is alert and oriented to person, place, and time.  ?Psychiatric:     ?   Mood and Affect: Mood normal.     ?   Behavior: Behavior normal.  ? ? ?BP (!) 144/93   Pulse 85   Ht 6' (1.829 m)   Wt 233 lb 9.6 oz (106 kg)   SpO2 98%   BMI 31.68 kg/m?  ?Wt Readings from Last 3 Encounters:  ?10/13/21 233 lb 9.6 oz (106 kg)  ?05/28/21 234 lb 12.8 oz (106.5 kg)  ?11/25/20 226 lb 1.3 oz (102.5 kg)  ? ? ? ?Health Maintenance Due  ?Topic Date Due  ? COVID-19 Vaccine (1) Never done  ? ? ?There are no preventive care reminders to display for this patient. ? ?Lab Results  ?Component Value Date  ? TSH 2.230 06/18/2019  ? ?Lab Results  ?Component Value Date  ? WBC 5.6 05/28/2021  ? HGB 17.0 05/28/2021  ? HCT 50.1 05/28/2021  ? MCV 87 05/28/2021  ? PLT 165 05/28/2021  ? ?Lab Results  ?Component Value Date  ? NA 141 05/28/2021  ? K 4.4 05/28/2021  ? CO2 25 05/28/2021  ? GLUCOSE 109 (H) 05/28/2021  ? BUN 12 05/28/2021  ? CREATININE 0.97 05/28/2021  ? BILITOT 0.4 05/28/2021  ? ALKPHOS 68 05/28/2021  ? AST 26 05/28/2021  ? ALT 37 05/28/2021  ? PROT 7.4 05/28/2021  ? ALBUMIN 4.6 05/28/2021  ? CALCIUM 9.2 05/28/2021  ? EGFR 97 05/28/2021  ? ?Lab Results  ?Component Value Date  ? CHOL 151 05/28/2021  ? ?Lab Results  ?Component Value Date  ? HDL 33 (L) 05/28/2021  ? ?Lab Results  ?Component Value Date  ? [place]86 05/28/2021  ? ?Lab Results   ?Component Value Date  ? TRIG 189 (H) 05/28/2021  ? ?Lab Results  ?Component Value Date  ? CHOLHDL 4.9 04/23/2020  ? ?Lab Results  ?Component Value Date  ? HGBA1C 5.6 11/25/2020  ? ? ?  ?Assessment & Plan:  ? ?  Problem List Items Addressed This Visit   ?None ?Visit Diagnoses   ? ? Pigmented skin lesion suspicious for malignant neoplasm    -  Primary  ? ?  ?1. Pigmented skin lesion suspicious for malignant neoplasm ?Given the lesion on physical exam and history of rapid growth, have placed urgent referral to Dermatology for biopsy to rule out malignant neoplasm.  ?- Ambulatory referral to Dermatology ? ?2. Essential hypertension ?Uncontrolled ?Counseled patient on continuing to use antihypertensive medication when his blood pressure is within target range.  ? ? ?No orders of the defined types were placed in this encounter. ? ? ?Follow-up: Return in about 3 months (around 01/13/2022) for skin lesion.  ? ? ?Annia Belt, Medical Student ? ?Evaluation and management procedures were performed by me with Medical Student in attendance, note written by Medical Student under my supervision and collaboration. I have reviewed the note and I agree with the management and plan. ? ?Mr Lyn Records presents with a left upper eyelid hypopigmented nodule with scaly surface and papule on flexor aspect of the L wrist with rough surface. ?Lesions are suspicious for verruca vulgaris but he will need a biopsy to exclude neoplasm and has been referred to dermatology. ? ?Charlott Rakes, MD, FAAFP. ?[place] ?[place], [place] ?[phone]   ?10/14/2021, 12:56 PM ? ?

## 2021-11-12 ENCOUNTER — Ambulatory Visit (INDEPENDENT_AMBULATORY_CARE_PROVIDER_SITE_OTHER): Payer: Self-pay | Admitting: Dermatology

## 2021-11-12 DIAGNOSIS — L820 Inflamed seborrheic keratosis: Secondary | ICD-10-CM

## 2021-11-12 DIAGNOSIS — L821 Other seborrheic keratosis: Secondary | ICD-10-CM

## 2021-11-12 NOTE — Patient Instructions (Addendum)

## 2021-11-12 NOTE — Progress Notes (Signed)
? ?  New Patient Visit ? ?Subjective  ?[facility] Seth Harris is a 46 y.o. male who presents for the following: Irregular skin lesions (On the L upper eyelid x 6-7 mths, itches at sometimes /L arm - irregular appearing has been present for about 8 years, itches at times). ?The patient has spots, moles and lesions to be evaluated, some may be new or changing and the patient has concerns that these could be cancer. ? ?The following portions of the chart were reviewed this encounter and updated as appropriate:  ? Tobacco  Allergies  Meds  Problems  Med Hx  Surg Hx  Fam Hx   ?  ?Review of Systems:  No other skin or systemic complaints except as noted in HPI or Assessment and Plan. ? ?Objective  ?Well appearing patient in no apparent distress; mood and affect are within normal limits. ? ?A focused examination was performed including the face and L arm. Relevant physical exam findings are noted in the Assessment and Plan. ? ?L lat eyebrow x 1, L wrist x 1 (2) ?Erythematous stuck-on, waxy papule or plaque ? ? ? ? ? ? ? ? ? ?Assessment & Plan  ?Inflamed seborrheic keratosis (2) ?L lat eyebrow x 1, L wrist x 1 ? ?Destruction of lesion - L lat eyebrow x 1, L wrist x 1 ?Complexity: simple   ?Destruction method: cryotherapy   ?Informed consent: discussed and consent obtained   ?Timeout:  patient name, date of birth, surgical site, and procedure verified ?Lesion destroyed using liquid nitrogen: Yes   ?Region frozen until ice ball extended beyond lesion: Yes   ?Outcome: patient tolerated procedure well with no complications   ?Post-procedure details: wound care instructions given   ? ?Seborrheic Keratoses ?- Stuck-on, waxy, tan-brown papules and/or plaques  ?- Benign-appearing ?- Discussed benign etiology and prognosis. ?- Observe ?- Call for any changes ? ?Return in 6 weeks (on 12/24/2021) for ISK follow up . ? ?ICari Caraway, CMA, am acting as scribe for Armida Sans, MD . ?Documentation: I have reviewed the  above documentation for accuracy and completeness, and I agree with the above. ? ?Armida Sans, MD ? ? ?

## 2021-11-24 ENCOUNTER — Encounter: Payer: Self-pay | Admitting: Dermatology

## 2021-11-25 ENCOUNTER — Other Ambulatory Visit: Payer: Self-pay

## 2021-11-25 ENCOUNTER — Ambulatory Visit: Payer: Self-pay | Attending: Family Medicine | Admitting: Family Medicine

## 2021-11-25 ENCOUNTER — Encounter: Payer: Self-pay | Admitting: Family Medicine

## 2021-11-25 VITALS — BP 134/81 | HR 96 | Temp 98.8°F | Resp 12 | Wt 235.0 lb

## 2021-11-25 DIAGNOSIS — G8929 Other chronic pain: Secondary | ICD-10-CM

## 2021-11-25 DIAGNOSIS — M25562 Pain in left knee: Secondary | ICD-10-CM

## 2021-11-25 DIAGNOSIS — I10 Essential (primary) hypertension: Secondary | ICD-10-CM

## 2021-11-25 DIAGNOSIS — Z1211 Encounter for screening for malignant neoplasm of colon: Secondary | ICD-10-CM

## 2021-11-25 DIAGNOSIS — M25561 Pain in right knee: Secondary | ICD-10-CM

## 2021-11-25 MED ORDER — AMLODIPINE BESYLATE 2.5 MG PO TABS
2.5000 mg | ORAL_TABLET | Freq: Every day | ORAL | 1 refills | Status: DC
  Filled 2021-11-25 – 2022-01-13 (×2): qty 90, 90d supply, fill #0
  Filled 2022-05-03: qty 90, 90d supply, fill #1

## 2021-11-25 NOTE — Patient Instructions (Signed)
Dolor de rodilla crnico en los adultos Chronic Knee Pain, Adult El dolor de rodilla crnico es el dolor en una o en ambas rodillas que dura ms de 3 meses. Los sntomas de dolor de rodilla crnico pueden incluir hinchazn, rigidez y malestar. El desgaste de la articulacin de la rodilla (osteoartritis) relacionado con la edad es la causa ms frecuente de dolor de rodilla crnico. Otras causas posibles incluyen lo siguiente: Una enfermedad a largo plazo relacionada con el sistema inmunitario que causa inflamacin de la rodilla (artritis reumatoide). Por lo general, afecta ambas rodillas. Artritis inflamatoria, como gota o pseudogota. Una lesin en la rodilla que causa artritis. Una lesin en la rodilla que daa los ligamentos. Los ligamentos son tejidos fuertes que conectan los huesos entre s. Rodilla de corredor o dolor detrs de la rtula. El tratamiento para el dolor de rodilla crnico depende de su causa. Los tratamientos principales para el dolor de rodilla crnico son la fisioterapia y la prdida de peso. Esta afeccin tambin puede tratarse con medicamentos, inyecciones, una rodillera o un dispositivo ortopdico, y el uso de muletas. Adems, puede recomendarse reposo, hielo, presin (compresin) y elevacin, lo que tambin se conoce como terapia RHCE. Siga estas instrucciones en su casa: Si tiene una rodillera o un dispositivo ortopdico:  Use la rodillera o el dispositivo ortopdico como se lo haya indicado el mdico. Quteselos solamente como se lo haya indicado el mdico. Afljelos si siente hormigueo en los dedos del pie, se le adormecen o se le enfran y se tornan azulados. Mantngalos limpios. Si la rodillera o el dispositivo ortopdico no son impermeables: No deje que se mojen. Quteselos, si el mdico se lo permite, o cbralos con un envoltorio hermtico cuando tome un bao de inmersin o una ducha. Control del dolor, la rigidez y la hinchazn     Si se lo indican, aplique calor  en la zona afectada con la frecuencia que le haya indicado el mdico. Use la fuente de calor que el mdico le recomiende, como una compresa de calor hmedo o una almohadilla trmica. Si usa una rodillera o un dispositivo ortopdico desmontables, quteselos segn las indicaciones del mdico. Coloque una toalla entre la piel y la fuente de calor. Aplique calor durante 20 a 30 minutos. Retire la fuente de calor si la piel se pone de color rojo brillante. Esto es especialmente importante si no puede sentir dolor, calor o fro. Puede correr un riesgo mayor de sufrir quemaduras. Si se lo indican, aplique hielo sobre la zona afectada. Para hacer esto: Si usa una rodillera o un dispositivo ortopdico desmontables, quteselos segn las indicaciones del mdico. Ponga el hielo en una bolsa plstica. Coloque una toalla entre la piel y la bolsa. Aplique el hielo durante 20 minutos, 2 o 3 veces por da. Retire el hielo si la piel se pone de color rojo brillante. Esto es muy importante. Si no puede sentir dolor, calor o fro, tiene un mayor riesgo de que se dae la zona. Mueva los dedos del pie con frecuencia para reducir la rigidez y la hinchazn. Cuando est sentado o acostado, levante (eleve) la zona lesionada por encima del nivel del corazn. Actividad Evite las actividades o los ejercicios de alto impacto, como correr, saltar la soga o hacer saltos de tijera. Siga el plan de ejercicios que el mdico dise para usted. El mdico puede recomendarle lo siguiente: Evitar las actividades que empeoren el dolor de rodilla. Esto puede exigirle que modifique sus rutinas de ejercicio, participacin en deportes u   obligaciones laborales. Usar calzado con suelas acolchonadas. Evitar deportes que requieran correr y cambiar de direccin repentinamente. Realizar fisioterapia. La fisioterapia est planificada para satisfacer sus necesidades y capacidades. Puede incluir ejercicios para la fuerza, la flexibilidad, la  estabilidad y la resistencia. Haga ejercicios que mejoren el equilibrio y la fuerza, como el tai chi y el yoga. No apoye el peso del cuerpo sobre la extremidad lesionada hasta que lo autorice el mdico. Use las muletas como se lo haya indicado el mdico. Retome sus actividades normales segn lo indicado por el mdico. Pregntele al mdico qu actividades son seguras para usted. Indicaciones generales Use los medicamentos de venta libre y los recetados solamente como se lo haya indicado el mdico. Baje de peso si es necesario. Perder incluso un poco de peso puede reducir el dolor de rodilla. Pregntele al mdico cul es su peso ideal y cmo adelgazar sin riesgos. Un nutricionista puede ayudarlo a planificar sus comidas. No consuma ningn producto que contenga nicotina o tabaco, como cigarrillos, cigarrillos electrnicos y tabaco de mascar. Estos pueden retrasar la recuperacin. Si necesita ayuda para dejar de consumir estos productos, consulte al mdico. Cumpla con todas las visitas de seguimiento. Esto es importante. Comunquese con un mdico si: Tiene un dolor de rodilla que no mejora o que empeora. No puede realizar los ejercicios de fisioterapia debido al dolor de rodilla. Solicite ayuda de inmediato si: La rodilla se hincha y la hinchazn empeora. No puede mover la rodilla. Siente dolor intenso en la rodilla. Resumen El dolor de rodilla que dura ms de 3 meses se considera dolor de rodilla crnico. Los tratamientos principales para el dolor de rodilla crnico son la fisioterapia y la prdida de peso. Tambin es posible que deba tomar medicamentos, usar una rodillera o un dispositivo ortopdico, usar muletas y aplicarse hielo o calor en la rodilla. Perder incluso un poco de peso puede reducir el dolor de rodilla. Pregntele al mdico cul es su peso ideal y cmo adelgazar sin riesgos. Un nutricionista puede ayudarlo a planificar sus comidas. Siga el plan de ejercicios que el mdico dise para  usted. Esta informacin no tiene como fin reemplazar el consejo del mdico. Asegrese de hacerle al mdico cualquier pregunta que tenga. Document Revised: 02/05/2020 Document Reviewed: 02/05/2020 Elsevier Patient Education  2023 [employer] Inc.  

## 2021-11-25 NOTE — Progress Notes (Signed)
F/u HTN 

## 2021-11-25 NOTE — Progress Notes (Signed)
? ?Subjective:  ?Patient ID: Seth Harris, male    DOB: 11-05-1975  Age: 46 y.o. MRN: [id number] ? ?CC: Hypertension ? ? ?HPI ?[facility] [name] is a 46 y.o. year old male with a history of hypertension here for chronic disease management. ? ?Interval History: ?He was able to see Dermatology for lesion on his face and wrist and he had freezing of L forearm lesion and none of the lesions were malignant. ? ?Endorses adherence to amlodipine.  He gets his major form of exercise at his job. ?He does have have intermittent bilateral knee pain which alternates but is absent at the moment.  Denies presence of knee swelling.  He currently does not take any OTC analgesics for his symptoms. ?Past Medical History:  ?Diagnosis Date  ? Allergy   ? Environmental allergies   ? Hypertension   ? ? ?Past Surgical History:  ?Procedure Laterality Date  ? HERNIA REPAIR    ? ? ?Family History  ?Problem Relation Age of Onset  ? Diabetes Mother   ? Diabetes Brother   ? ? ?Social History  ? ?Socioeconomic History  ? Marital status: Married  ?  Spouse name: Not on file  ? Number of children: Not on file  ? Years of education: Not on file  ? Highest education level: Not on file  ?Occupational History  ? Not on file  ?Tobacco Use  ? Smoking status: Former  ?  Types: Cigarettes  ?  Quit date: 11/09/2001  ?  Years since quitting: 20.0  ? Smokeless tobacco: Never  ?Vaping Use  ? Vaping Use: Never used  ?Substance and Sexual Activity  ? Alcohol use: No  ? Drug use: No  ? Sexual activity: Yes  ?  Birth control/protection: None  ?Other Topics Concern  ? Not on file  ?Social History Narrative  ? Not on file  ? ?Social Determinants of Health  ? ?Financial Resource Strain: Not on file  ?Food Insecurity: Not on file  ?Transportation Needs: Not on file  ?Physical Activity: Not on file  ?Stress: Not on file  ?Social Connections: Not on file  ? ? ?No Known Allergies ? ?Outpatient Medications Prior to Visit  ?Medication Sig  Dispense Refill  ? amLODipine (NORVASC) 2.5 MG tablet Take 1 tablet (2.5 mg total) by mouth daily. 90 tablet 1  ? chlorhexidine (PERIDEX) 0.12 % solution SMARTSIG:By Mouth (Patient not taking: Reported on 05/28/2021)    ? ?No facility-administered medications prior to visit.  ? ? ? ?ROS ?Review of Systems  ?Constitutional:  Negative for activity change and appetite change.  ?HENT:  Negative for sinus pressure and sore throat.   ?Eyes:  Negative for visual disturbance.  ?Respiratory:  Negative for cough, chest tightness and shortness of breath.   ?Cardiovascular:  Negative for chest pain and leg swelling.  ?Gastrointestinal:  Negative for abdominal distention, abdominal pain, constipation and diarrhea.  ?Endocrine: Negative.   ?Genitourinary:  Negative for dysuria.  ?Musculoskeletal:   ?     See HPI  ?Skin:  Negative for rash.  ?Allergic/Immunologic: Negative.   ?Neurological:  Negative for weakness, light-headedness and numbness.  ?Psychiatric/Behavioral:  Negative for dysphoric mood and suicidal ideas.   ? ?Objective:  ?BP 134/81   Pulse 96   Temp 98.8 ?F (37.1 ?C) (Oral)   Resp 12   Wt 235 lb (106.6 kg)   SpO2 97%   BMI 31.87 kg/m?  ? ? ?  11/25/2021  ?  8:40 AM 10/13/2021  ?  4:02 PM 05/28/2021  ?  8:41 AM  ?BP/Weight  ?Systolic BP 134 144 143  ?Diastolic BP 81 93 84  ?Wt. (Lbs) 235 233.6 234.8  ?BMI 31.87 kg/m2 31.68 kg/m2 27.13 kg/m2  ? ? ? ? ?Physical Exam ?Constitutional:   ?   Appearance: He is well-developed.  ?Cardiovascular:  ?   Rate and Rhythm: Normal rate.  ?   Heart sounds: Normal heart sounds. No murmur heard. ?Pulmonary:  ?   Effort: Pulmonary effort is normal.  ?   Breath sounds: Normal breath sounds. No wheezing or rales.  ?Chest:  ?   Chest wall: No tenderness.  ?Abdominal:  ?   General: Bowel sounds are normal. There is no distension.  ?   Palpations: Abdomen is soft. There is no mass.  ?   Tenderness: There is no abdominal tenderness.  ?Musculoskeletal:  ?   Right lower leg: No edema.  ?    Left lower leg: No edema.  ?   Comments: Normal appearance of both knees ?Normal range of motion, no crepitus or tenderness on range of motion  ?Neurological:  ?   Mental Status: He is alert and oriented to person, place, and time.  ?Psychiatric:     ?   Mood and Affect: Mood normal.  ? ? ? ?  Latest Ref Rng & Units 05/28/2021  ?  8:59 AM 11/25/2020  ?  2:06 PM 04/23/2020  ?  8:38 AM  ?CMP  ?Glucose 70 - 99 mg/dL 465   88   035    ?BUN 6 - 24 mg/dL 12   11   15     ?Creatinine 0.76 - 1.27 mg/dL   4.65   6.81    ?Sodium 134 - 144 mmol/L 141   140   140    ?Potassium 3.5 - 5.2 mmol/L 4.4   4.3   4.4    ?Chloride 96 - 106 mmol/L 102   102   102    ?CO2 20 - 29 mmol/L 25   24   25     ?Calcium 8.7 - 10.2 mg/dL 9.2   2.75   9.4    ?Total Protein 6.0 - 8.5 g/dL 7.4    7.6    ?Total Bilirubin 0.0 - 1.2 mg/dL 0.4    0.6    ?Alkaline Phos 44 - 121 IU/L 68    65    ?AST 0 - 40 IU/L 26    26    ?ALT 0 - 44 IU/L 37    33    ? ? ?Lipid Panel  ?   ?Component Value Date/Time  ? CHOL 151 05/28/2021 0859  ? TRIG 189 (H) 05/28/2021 0859  ? HDL 33 (L) 05/28/2021 0859  ? CHOLHDL 4.9 04/23/2020 0838  ? CHOLHDL 3.4 08/26/2016 0910  ? VLDL 27 08/31/2015 0955  ? LDLCALC 86 05/28/2021 0859  ? ? ?CBC ?   ?Component Value Date/Time  ? WBC 5.6 05/28/2021 0859  ? WBC 4.6 08/26/2016 0910  ? RBC 5.73 05/28/2021 0859  ? RBC 5.55 08/26/2016 0910  ? HGB 17.0 05/28/2021 0859  ? HCT 50.1 05/28/2021 0859  ? PLT 165 05/28/2021 0859  ? MCV 87 05/28/2021 0859  ? MCH 29.7 05/28/2021 0859  ? MCH 30.1 08/26/2016 0910  ? MCHC 33.9 05/28/2021 0859  ? MCHC 34.1 08/26/2016 0910  ? RDW 11.9 05/28/2021 0859  ? LYMPHSABS 2.3 05/28/2021 0859  ? MONOABS 276 08/26/2016 0910  ? EOSABS 0.1  05/28/2021 0859  ? BASOSABS 0.0 05/28/2021 0859  ? ? ?Lab Results  ?Component Value Date  ? HGBA1C 5.6 11/25/2020  ? ? ?Assessment & Plan:  ?1. Essential hypertension ?Controlled ?Counseled on blood pressure goal of less than 130/80, low-sodium, DASH diet, medication compliance, 150  minutes of moderate intensity exercise per week. ?Discussed medication compliance, adverse effects. ?- amLODipine (NORVASC) 2.5 MG tablet; Take 1 tablet (2.5 mg total) by mouth daily.  Dispense: 90 tablet; Refill: 1 ? ?2. Screening for colon cancer ?- Fecal occult blood, imunochemical ? ?3. Chronic pain of both knees ?Pain is absent at the moment ?Advised to use NSAIDs like Aleve, ibuprofen ?Also to use knee brace, apply ice as needed ? ? ? ?No orders of the defined types were placed in this encounter. ? ? ?Follow-up: Return in about 6 months (around 05/28/2022) for Chronic medical conditions.  ? ? ? ? ? ?Hoy RegisterEnobong Taeshaun Rames, MD, FAAFP. ?[facility] ?[place], [place] ?[phone]   ?11/25/2021, 8:56 AM ?

## 2021-12-02 ENCOUNTER — Other Ambulatory Visit: Payer: Self-pay

## 2021-12-05 LAB — FECAL OCCULT BLOOD, IMMUNOCHEMICAL: Fecal Occult Bld: NEGATIVE

## 2021-12-31 ENCOUNTER — Ambulatory Visit: Payer: PRIVATE HEALTH INSURANCE | Admitting: Dermatology

## 2021-12-31 DIAGNOSIS — L820 Inflamed seborrheic keratosis: Secondary | ICD-10-CM

## 2021-12-31 DIAGNOSIS — L578 Other skin changes due to chronic exposure to nonionizing radiation: Secondary | ICD-10-CM

## 2021-12-31 DIAGNOSIS — L821 Other seborrheic keratosis: Secondary | ICD-10-CM

## 2021-12-31 NOTE — Patient Instructions (Addendum)
Cryotherapy Aftercare  Wash gently with soap and water everyday.   Apply Vaseline and Band-Aid daily until healed.     Due to recent changes in healthcare laws, you may see results of your pathology and/or laboratory studies on MyChart before the doctors have had a chance to review them. We understand that in some cases there may be results that are confusing or concerning to you. Please understand that not all results are received at the same time and often the doctors may need to interpret multiple results in order to provide you with the best plan of care or course of treatment. Therefore, we ask that you please give us 2 business days to thoroughly review all your results before contacting the office for clarification. Should we see a critical lab result, you will be contacted sooner.   If You Need Anything After Your Visit  If you have any questions or concerns for your doctor, please call our main line at [phone] and press option 4 to reach your doctor's medical assistant. If no one answers, please leave a voicemail as directed and we will return your call as soon as possible. Messages left after 4 pm will be answered the following business day.   You may also send us a message via MyChart. We typically respond to MyChart messages within 1-2 business days.  For prescription refills, please ask your pharmacy to contact our office. Our fax number is [phone].  If you have an urgent issue when the clinic is closed that cannot wait until the next business day, you can page your doctor at the number below.    Please note that while we do our best to be available for urgent issues outside of office hours, we are not available 24/7.   If you have an urgent issue and are unable to reach us, you may choose to seek medical care at your doctor's office, retail clinic, urgent care center, or emergency room.  If you have a medical emergency, please immediately call 911 or go to the  emergency department.  Pager Numbers  - Dr. Kowalski: [phone]  - Dr. Moye: [phone]  - Dr. Stewart: [phone]  In the event of inclement weather, please call our main line at [phone] for an update on the status of any delays or closures.  Dermatology Medication Tips: Please keep the boxes that topical medications come in in order to help keep track of the instructions about where and how to use these. Pharmacies typically print the medication instructions only on the boxes and not directly on the medication tubes.   If your medication is too expensive, please contact our office at [phone] option 4 or send us a message through MyChart.   We are unable to tell what your co-pay for medications will be in advance as this is different depending on your insurance coverage. However, we may be able to find a substitute medication at lower cost or fill out paperwork to get insurance to cover a needed medication.   If a prior authorization is required to get your medication covered by your insurance company, please allow us 1-2 business days to complete this process.  Drug prices often vary depending on where the prescription is filled and some pharmacies may offer cheaper prices.  The website www.goodrx.com contains coupons for medications through different pharmacies. The prices here do not account for what the cost may be with help from insurance (it may be cheaper with your insurance), but the website can   give you the price if you did not use any insurance.  - You can print the associated coupon and take it with your prescription to the pharmacy.  - You may also stop by our office during regular business hours and pick up a GoodRx coupon card.  - If you need your prescription sent electronically to a different pharmacy, notify our office through [place] MyChart or by phone at [phone] option 4.     Si Usted Necesita Algo Despus de Su Visita  Tambin puede  enviarnos un mensaje a travs de MyChart. Por lo general respondemos a los mensajes de MyChart en el transcurso de 1 a 2 das hbiles.  Para renovar recetas, por favor pida a su farmacia que se ponga en contacto con nuestra oficina. Nuestro nmero de fax es el [phone].  Si tiene un asunto urgente cuando la clnica est cerrada y que no puede esperar hasta el siguiente da hbil, puede llamar/localizar a su doctor(a) al nmero que aparece a continuacin.   Por favor, tenga en cuenta que aunque hacemos todo lo posible para estar disponibles para asuntos urgentes fuera del horario de oficina, no estamos disponibles las 24 horas del da, los 7 das de la semana.   Si tiene un problema urgente y no puede comunicarse con nosotros, puede optar por buscar atencin mdica  en el consultorio de su doctor(a), en una clnica privada, en un centro de atencin urgente o en una sala de emergencias.  Si tiene una emergencia mdica, por favor llame inmediatamente al 911 o vaya a la sala de emergencias.  Nmeros de bper  - Dr. Kowalski: [phone]  - Dra. Moye: [phone]  - Dra. Stewart: [phone]  En caso de inclemencias del tiempo, por favor llame a nuestra lnea principal al [phone] para una actualizacin sobre el estado de cualquier retraso o cierre.  Consejos para la medicacin en dermatologa: Por favor, guarde las cajas en las que vienen los medicamentos de uso tpico para ayudarle a seguir las instrucciones sobre dnde y cmo usarlos. Las farmacias generalmente imprimen las instrucciones del medicamento slo en las cajas y no directamente en los tubos del medicamento.   Si su medicamento es muy caro, por favor, pngase en contacto con nuestra oficina llamando al [phone] y presione la opcin 4 o envenos un mensaje a travs de MyChart.   No podemos decirle cul ser su copago por los medicamentos por adelantado ya que esto es diferente dependiendo de la cobertura de su seguro.  Sin embargo, es posible que podamos encontrar un medicamento sustituto a menor costo o llenar un formulario para que el seguro cubra el medicamento que se considera necesario.   Si se requiere una autorizacin previa para que su compaa de seguros cubra su medicamento, por favor permtanos de 1 a 2 das hbiles para completar este proceso.  Los precios de los medicamentos varan con frecuencia dependiendo del lugar de dnde se surte la receta y alguna farmacias pueden ofrecer precios ms baratos.  El sitio web www.goodrx.com tiene cupones para medicamentos de diferentes farmacias. Los precios aqu no tienen en cuenta lo que podra costar con la ayuda del seguro (puede ser ms barato con su seguro), pero el sitio web puede darle el precio si no utiliz ningn seguro.  - Puede imprimir el cupn correspondiente y llevarlo con su receta a la farmacia.  - Tambin puede pasar por nuestra oficina durante el horario de atencin regular y recoger una tarjeta de cupones de GoodRx.  -   Si necesita que su receta se enve electrnicamente a una farmacia diferente, informe a nuestra oficina a travs de MyChart de [place] o por telfono llamando al [phone] y presione la opcin 4.  

## 2021-12-31 NOTE — Progress Notes (Unsigned)
   Follow-Up Visit   Subjective  Seth Harris is a 46 y.o. male who presents for the following: ISK f/u (L lat eyebrow, L wrist, 6wk f/u, one on L wrist not resolved). The patient has spots, moles and lesions to be evaluated, some may be new or changing and the patient has concerns that these could be cancer.  The following portions of the chart were reviewed this encounter and updated as appropriate:   Tobacco  Allergies  Meds  Problems  Med Hx  Surg Hx  Fam Hx     Review of Systems:  No other skin or systemic complaints except as noted in HPI or Assessment and Plan.  Objective  Well appearing patient in no apparent distress; mood and affect are within normal limits.  A focused examination was performed including face, L wrist. Relevant physical exam findings are noted in the Assessment and Plan.  L wrist x 1, L lat eyebrow x 1 (2) Residual stuck on waxy paps with erythema   Assessment & Plan  Inflamed seborrheic keratosis (2) L wrist x 1, L lat eyebrow x 1  Symptomatic, irritating, patient would like treated.  Improved but not resolved  RTC if not resolved in 6 weeks  Destruction of lesion - L wrist x 1, L lat eyebrow x 1 Complexity: simple   Destruction method: cryotherapy   Informed consent: discussed and consent obtained   Timeout:  patient name, date of birth, surgical site, and procedure verified Lesion destroyed using liquid nitrogen: Yes   Region frozen until ice ball extended beyond lesion: Yes   Outcome: patient tolerated procedure well with no complications   Post-procedure details: wound care instructions given    Actinic Damage - chronic, secondary to cumulative UV radiation exposure/sun exposure over time - diffuse scaly erythematous macules with underlying dyspigmentation - Recommend daily broad spectrum sunscreen SPF 30+ to sun-exposed areas, reapply every 2 hours as needed.  - Recommend staying in the shade or wearing long  sleeves, sun glasses (UVA+UVB protection) and wide brim hats (4-inch brim around the entire circumference of the hat). - Call for new or changing lesions.  Seborrheic Keratoses - Stuck-on, waxy, tan-brown papules and/or plaques  - Benign-appearing - Discussed benign etiology and prognosis. - Observe - Call for any changes  Return if symptoms worsen or fail to improve.  I, Ardis Rowan, RMA, am acting as scribe for Armida Sans, MD . Documentation: I have reviewed the above documentation for accuracy and completeness, and I agree with the above.  Armida Sans, MD  Documentation: I have reviewed the above documentation for accuracy and completeness, and I agree with the above.  Armida Sans, MD

## 2022-01-05 ENCOUNTER — Encounter: Payer: Self-pay | Admitting: Dermatology

## 2022-01-13 ENCOUNTER — Other Ambulatory Visit: Payer: Self-pay

## 2022-01-18 ENCOUNTER — Ambulatory Visit: Payer: No Typology Code available for payment source | Admitting: Family Medicine

## 2022-05-03 ENCOUNTER — Other Ambulatory Visit: Payer: Self-pay

## 2022-05-04 ENCOUNTER — Other Ambulatory Visit: Payer: Self-pay

## 2022-05-04 MED ORDER — AMOXICILLIN 500 MG PO CAPS
500.0000 mg | ORAL_CAPSULE | Freq: Three times a day (TID) | ORAL | 0 refills | Status: DC
  Filled 2022-05-04: qty 30, 10d supply, fill #0

## 2022-05-05 ENCOUNTER — Other Ambulatory Visit: Payer: Self-pay

## 2022-06-01 ENCOUNTER — Ambulatory Visit: Payer: Self-pay | Attending: Family Medicine | Admitting: Family Medicine

## 2022-06-01 ENCOUNTER — Other Ambulatory Visit: Payer: Self-pay

## 2022-06-01 ENCOUNTER — Encounter: Payer: Self-pay | Admitting: Family Medicine

## 2022-06-01 VITALS — BP 138/90 | HR 73 | Temp 98.4°F | Ht 72.0 in | Wt 243.0 lb

## 2022-06-01 DIAGNOSIS — Z131 Encounter for screening for diabetes mellitus: Secondary | ICD-10-CM

## 2022-06-01 DIAGNOSIS — I10 Essential (primary) hypertension: Secondary | ICD-10-CM

## 2022-06-01 MED ORDER — AMLODIPINE BESYLATE 2.5 MG PO TABS
2.5000 mg | ORAL_TABLET | Freq: Every day | ORAL | 1 refills | Status: DC
  Filled 2022-06-01 – 2022-08-05 (×2): qty 90, 90d supply, fill #0
  Filled 2022-08-05: qty 13, 13d supply, fill #0
  Filled 2022-08-05: qty 77, 77d supply, fill #0
  Filled 2022-11-24: qty 90, 90d supply, fill #1

## 2022-06-01 NOTE — Patient Instructions (Signed)
Exercising to Stay Healthy To become healthy and stay healthy, it is recommended that you do moderate-intensity and vigorous-intensity exercise. You can tell that you are exercising at a moderate intensity if your heart starts beating faster and you start breathing faster but can still hold a conversation. You can tell that you are exercising at a vigorous intensity if you are breathing much harder and faster and cannot hold a conversation while exercising. How can exercise benefit me? Exercising regularly is important. It has many health benefits, such as: Improving overall fitness, flexibility, and endurance. Increasing bone density. Helping with weight control. Decreasing body fat. Increasing muscle strength and endurance. Reducing stress and tension, anxiety, depression, or anger. Improving overall health. What guidelines should I follow while exercising? Before you start a new exercise program, talk with your health care provider. Do not exercise so much that you hurt yourself, feel dizzy, or get very short of breath. Wear comfortable clothes and wear shoes with good support. Drink plenty of water while you exercise to prevent dehydration or heat stroke. Work out until your breathing and your heartbeat get faster (moderate intensity). How often should I exercise? Choose an activity that you enjoy, and set realistic goals. Your health care provider can help you make an activity plan that is individually designed and works best for you. Exercise regularly as told by your health care provider. This may include: Doing strength training two times a week, such as: Lifting weights. Using resistance bands. Push-ups. Sit-ups. Yoga. Doing a certain intensity of exercise for a given amount of time. Choose from these options: A total of 150 minutes of moderate-intensity exercise every week. A total of 75 minutes of vigorous-intensity exercise every week. A mix of moderate-intensity and  vigorous-intensity exercise every week. Children, pregnant women, people who have not exercised regularly, people who are overweight, and older adults may need to talk with a health care provider about what activities are safe to perform. If you have a medical condition, be sure to talk with your health care provider before you start a new exercise program. What are some exercise ideas? Moderate-intensity exercise ideas include: Walking 1 mile (1.6 km) in about 15 minutes. Biking. Hiking. Golfing. Dancing. Water aerobics. Vigorous-intensity exercise ideas include: Walking 4.5 miles (7.2 km) or more in about 1 hour. Jogging or running 5 miles (8 km) in about 1 hour. Biking 10 miles (16.1 km) or more in about 1 hour. Lap swimming. Roller-skating or in-line skating. Cross-country skiing. Vigorous competitive sports, such as football, basketball, and soccer. Jumping rope. Aerobic dancing. What are some everyday activities that can help me get exercise? Yard work, such as: Pushing a lawn mower. Raking and bagging leaves. Washing your car. Pushing a stroller. Shoveling snow. Gardening. Washing windows or floors. How can I be more active in my day-to-day activities? Use stairs instead of an elevator. Take a walk during your lunch break. If you drive, park your car farther away from your work or school. If you take public transportation, get off one stop early and walk the rest of the way. Stand up or walk around during all of your indoor phone calls. Get up, stretch, and walk around every 30 minutes throughout the day. Enjoy exercise with a friend. Support to continue exercising will help you keep a regular routine of activity. Where to find more information You can find more information about exercising to stay healthy from: U.S. Department of Health and Human Services: www.hhs.gov Centers for Disease Control and Prevention (  CDC): www.cdc.gov Summary Exercising regularly is  important. It will improve your overall fitness, flexibility, and endurance. Regular exercise will also improve your overall health. It can help you control your weight, reduce stress, and improve your bone density. Do not exercise so much that you hurt yourself, feel dizzy, or get very short of breath. Before you start a new exercise program, talk with your health care provider. This information is not intended to replace advice given to you by your health care provider. Make sure you discuss any questions you have with your health care provider. Document Revised: 10/31/2020 Document Reviewed: 10/31/2020 Elsevier Patient Education  2023 Elsevier Inc.  

## 2022-06-01 NOTE — Progress Notes (Signed)
Subjective:  Patient ID: Seth Harris, male    DOB: October 13, 1975  Age: 46 y.o. MRN: [id number]  CC: Hypertension   [facility]PI [facility] Seth Harris is a 46 y.o. year old male with a history of Hypertension here for a follow up visit.  Interval History:  His BP is elevated and he is yet to take his antihypertensive today as he was fasting in anticipation of labs otherwise he endorses adherence with his amlodipine. His major form of exercise is at his job. At his last visit he had complained of knee pain which has improved significantly. Denies additional concerns today.  Past Medical History:  Diagnosis Date   Allergy    Environmental allergies    Hypertension     Past Surgical History:  Procedure Laterality Date   HERNIA REPAIR      Family History  Problem Relation Age of Onset   Diabetes Mother    Diabetes Brother     Social History   Socioeconomic History   Marital status: Married    Spouse name: Not on file   Number of children: Not on file   Years of education: Not on file   Highest education level: Not on file  Occupational History   Not on file  Tobacco Use   Smoking status: Former    Types: Cigarettes    Quit date: 11/09/2001    Years since quitting: 20.5   Smokeless tobacco: Never  Vaping Use   Vaping Use: Never used  Substance and Sexual Activity   Alcohol use: No   Drug use: No   Sexual activity: Yes    Birth control/protection: None  Other Topics Concern   Not on file  Social History Narrative   Not on file   Social Determinants of Health   Financial Resource Strain: Not on file  Food Insecurity: Not on file  Transportation Needs: Not on file  Physical Activity: Not on file  Stress: Not on file  Social Connections: Not on file    No Known Allergies  Outpatient Medications Prior to Visit  Medication Sig Dispense Refill   amLODipine (NORVASC) 2.5 MG tablet Take 1 tablet (2.5 mg total) by mouth daily. 90 tablet  1   amoxicillin (AMOXIL) 500 MG capsule Take 1 capsule (500 mg total) by mouth 3 (three) times daily. (Patient not taking: Reported on 06/01/2022) 30 capsule 0   omeprazole (PRILOSEC) 20 MG capsule Take 1 capsule (20 mg total) by mouth daily. (Patient not taking: Reported on 06/18/2019) 30 capsule 6   No facility-administered medications prior to visit.     ROS Review of Systems  Constitutional:  Negative for activity change and appetite change.  HENT:  Negative for sinus pressure and sore throat.   Respiratory:  Negative for chest tightness, shortness of breath and wheezing.   Cardiovascular:  Negative for chest pain and palpitations.  Gastrointestinal:  Negative for abdominal distention, abdominal pain and constipation.  Genitourinary: Negative.   Musculoskeletal: Negative.   Psychiatric/Behavioral:  Negative for behavioral problems and dysphoric mood.     Objective:  BP (!) 143/90   Pulse 73   Temp 98.4 F (36.9 C) (Oral)   Ht 6' (1.829 m)   Wt 243 lb (110.2 kg)   SpO2 98%   BMI 32.96 kg/m      06/01/2022    8:37 AM 11/25/2021    8:40 AM 10/13/2021    4:02 PM  BP/Weight  Systolic BP 789 381 017  Diastolic  BP 90 81 93  Wt. (Lbs) 243 235 233.6  BMI 32.96 kg/m2 31.87 kg/m2 31.68 kg/m2      Physical Exam Constitutional:      Appearance: He is well-developed.  Cardiovascular:     Rate and Rhythm: Normal rate.     Heart sounds: Normal heart sounds. No murmur heard. Pulmonary:     Effort: Pulmonary effort is normal.     Breath sounds: Normal breath sounds. No wheezing or rales.  Chest:     Chest wall: No tenderness.  Abdominal:     General: Bowel sounds are normal. There is no distension.     Palpations: Abdomen is soft. There is no mass.     Tenderness: There is no abdominal tenderness.  Musculoskeletal:        General: Normal range of motion.     Right lower leg: No edema.     Left lower leg: No edema.  Neurological:     Mental Status: He is alert and  oriented to person, place, and time.  Psychiatric:        Mood and Affect: Mood normal.        Latest Ref Rng & Units 05/28/2021    8:59 AM 11/25/2020    2:06 PM 04/23/2020    8:38 AM  CMP  Glucose 70 - 99 mg/dL 109  88  113   BUN 6 - 24 mg/dL _0 Creatinine 0.76 - 1.27 mg/dL 0.97  0.83  0.95   Sodium 134 - 144 mmol/L 141  140  140   Potassium 3.5 - 5.2 mmol/L 4.4  4.3  4.4   Chloride 96 - 106 mmol/L 102  102  102   CO2 20 - 29 mmol/L _1 Calcium 8.7 - 10.2 mg/dL 9.2  10.0  9.4   Total Protein 6.0 - 8.5 g/dL 7.4   7.6   Total Bilirubin 0.0 - 1.2 mg/dL 0.4   0.6   Alkaline Phos 44 - 121 IU/L 68   65   AST 0 - 40 IU/L 26   26   ALT 0 - 44 IU/L 37   33     Lipid Panel     Component Value Date/Time   CHOL 151 05/28/2021 0859   TRIG 189 (H) 05/28/2021 0859   HDL 33 (L) 05/28/2021 0859   CHOLHDL 4.9 04/23/2020 0838   CHOLHDL 3.4 08/26/2016 0910   VLDL 27 08/31/2015 0955   LDLCALC 86 05/28/2021 0859    CBC    Component Value Date/Time   WBC 5.6 05/28/2021 0859   WBC 4.6 08/26/2016 0910   RBC 5.73 05/28/2021 0859   RBC 5.55 08/26/2016 0910   HGB 17.0 05/28/2021 0859   HCT 50.1 05/28/2021 0859   PLT 165 05/28/2021 0859   MCV 87 05/28/2021 0859   MCH 29.7 05/28/2021 0859   MCH 30.1 08/26/2016 0910   MCHC 33.9 05/28/2021 0859   MCHC 34.1 08/26/2016 0910   RDW 11.9 05/28/2021 0859   LYMPHSABS 2.3 05/28/2021 0859   MONOABS 276 08/26/2016 0910   EOSABS 0.1 05/28/2021 0859   BASOSABS 0.0 05/28/2021 0859    Lab Results  Component Value Date   HGBA1C 5.6 11/25/2020    Assessment & Plan:  1. Essential hypertension Likely above goal due to the fact that he is yet to take his antihypertensive Advised that at subsequent visits he needs to take his antihypertensive prior to his  visit No regimen change today - LP+Non-HDL Cholesterol - CMP14+EGFR - CBC with Differential/Platelet - amLODipine (NORVASC) 2.5 MG tablet; Take 1 tablet (2.5 mg total) by  mouth daily.  Dispense: 90 tablet; Refill: 1  2. Screening for diabetes mellitus - Hemoglobin A1c    Meds ordered this encounter  Medications   amLODipine (NORVASC) 2.5 MG tablet    Sig: Take 1 tablet (2.5 mg total) by mouth daily.    Dispense:  90 tablet    Refill:  1    Follow-up: Return in about 6 months (around 11/30/2022) for Chronic medical conditions.       Charlott Rakes, MD, FAAFP. [facility] [place], [place]   06/01/2022, 8:55 AM

## 2022-06-02 LAB — CMP14+EGFR
ALT: 33 IU/L (ref 0–44)
AST: 23 IU/L (ref 0–40)
Albumin/Globulin Ratio: 1.9 (ref 1.2–2.2)
Albumin: 4.5 g/dL (ref 4.1–5.1)
Alkaline Phosphatase: 73 IU/L (ref 44–121)
BUN/Creatinine Ratio: 14 (ref 9–20)
BUN: 14 mg/dL (ref 6–24)
Bilirubin Total: 0.3 mg/dL (ref 0.0–1.2)
CO2: 22 mmol/L (ref 20–29)
Calcium: 9.3 mg/dL (ref 8.7–10.2)
Chloride: 104 mmol/L (ref 96–106)
Creatinine, Ser: 1.01 mg/dL (ref 0.76–1.27)
Globulin, Total: 2.4 g/dL (ref 1.5–4.5)
Glucose: 113 mg/dL — ABNORMAL HIGH (ref 70–99)
Potassium: 4.4 mmol/L (ref 3.5–5.2)
Sodium: 142 mmol/L (ref 134–144)
Total Protein: 6.9 g/dL (ref 6.0–8.5)
eGFR: 92 mL/min/{1.73_m2} (ref >59)

## 2022-06-02 LAB — CBC WITH DIFFERENTIAL/PLATELET
Basophils Absolute: 0.1 10*3/uL (ref 0.0–0.2)
Basos: 1 %
EOS (ABSOLUTE): 0.2 10*3/uL (ref 0.0–0.4)
Eos: 3 %
Hematocrit: 48.7 % (ref 37.5–51.0)
Hemoglobin: 16.4 g/dL (ref 13.0–17.7)
Immature Grans (Abs): 0 10*3/uL (ref 0.0–0.1)
Immature Granulocytes: 0 %
Lymphocytes Absolute: 2.4 10*3/uL (ref 0.7–3.1)
Lymphs: 42 %
MCH: 29.8 pg (ref 26.6–33.0)
MCHC: 33.7 g/dL (ref 31.5–35.7)
MCV: 88 fL (ref 79–97)
Monocytes Absolute: 0.4 10*3/uL (ref 0.1–0.9)
Monocytes: 7 %
Neutrophils Absolute: 2.7 10*3/uL (ref 1.4–7.0)
Neutrophils: 47 %
Platelets: 161 10*3/uL (ref 150–450)
RBC: 5.51 x10E6/uL (ref 4.14–5.80)
RDW: 12.5 % (ref 11.6–15.4)
WBC: 5.7 10*3/uL (ref 3.4–10.8)

## 2022-06-02 LAB — LP+NON-HDL CHOLESTEROL
Cholesterol, Total: 143 mg/dL (ref 100–199)
HDL: 38 mg/dL — ABNORMAL LOW (ref >39)
LDL Chol Calc (NIH): 81 mg/dL (ref 0–99)
Total Non-HDL-Chol (LDL+VLDL): 105 mg/dL (ref 0–129)
Triglycerides: 138 mg/dL (ref 0–149)
VLDL Cholesterol Cal: 24 mg/dL (ref 5–40)

## 2022-06-02 LAB — HEMOGLOBIN A1C
Est. average glucose Bld gHb Est-mCnc: 120 mg/dL
Hgb A1c MFr Bld: 5.8 % — ABNORMAL HIGH (ref 4.8–5.6)

## 2022-08-04 ENCOUNTER — Other Ambulatory Visit: Payer: Self-pay | Admitting: Family Medicine

## 2022-08-04 DIAGNOSIS — I10 Essential (primary) hypertension: Secondary | ICD-10-CM

## 2022-08-04 NOTE — Telephone Encounter (Signed)
Rx was sent to pharmacy on 06/01/22 #90/1. Pt should have 1 RF remaining and enough medication to last until next OV.   Requested Prescriptions  Pending Prescriptions Disp Refills   amLODipine (NORVASC) 2.5 MG tablet 90 tablet 1    Sig: Take 1 tablet (2.5 mg total) by mouth daily.     Cardiovascular: Calcium Channel Blockers 2 Failed - 08/04/2022  3:29 PM      Failed - Last BP in normal range    BP Readings from Last 1 Encounters:  06/01/22 (!) 138/90         Passed - Last Heart Rate in normal range    Pulse Readings from Last 1 Encounters:  06/01/22 73         Passed - Valid encounter within last 6 months    Recent Outpatient Visits           2 months ago Screening for diabetes mellitus   [place], Charlane Ferretti, MD   8 months ago Screening for colon cancer   [place], [place], MD   9 months ago Pigmented skin lesion suspicious for malignant neoplasm   [place], Charlane Ferretti, MD   1 year ago Acute bilateral low back pain without sciatica   [place], [place], MD   1 year ago Essential hypertension   [place], [place], MD       Future Appointments             In 4 months Charlott Rakes, MD [place]

## 2022-08-04 NOTE — Telephone Encounter (Signed)
Medication Refill - Medication: amLODipine (NORVASC) 2.5 MG tablet   Has the patient contacted their pharmacy? Yes.   (Agent: If no, request that the patient contact the pharmacy for the refill. If patient does not wish to contact the pharmacy document the reason why and proceed with request.) (Agent: If yes, when and what did the pharmacy advise?)  Preferred Pharmacy (with phone number or street name):  [facility]   Has the patient been seen for an appointment in the last year OR does the patient have an upcoming appointment? Yes.    Agent: Please be advised that RX refills may take up to 3 business days. We ask that you follow-up with your pharmacy.

## 2022-08-05 ENCOUNTER — Other Ambulatory Visit: Payer: Self-pay

## 2022-08-13 ENCOUNTER — Ambulatory Visit: Payer: Self-pay | Attending: Family Medicine

## 2022-10-12 ENCOUNTER — Other Ambulatory Visit: Payer: Self-pay

## 2022-10-12 MED ORDER — SODIUM FLUORIDE 1.1 % DT CREA
TOPICAL_CREAM | Freq: Two times a day (BID) | DENTAL | 5 refills | Status: AC
  Filled 2022-10-12: qty 51, 30d supply, fill #0
  Filled 2022-12-06 – 2022-12-16 (×2): qty 51, 30d supply, fill #1

## 2022-10-13 ENCOUNTER — Other Ambulatory Visit: Payer: Self-pay

## 2022-10-13 ENCOUNTER — Ambulatory Visit (HOSPITAL_COMMUNITY)
Admission: EM | Admit: 2022-10-13 | Discharge: 2022-10-13 | Disposition: A | Discharge status: Home / Self Care | Payer: Self-pay | Attending: Emergency Medicine | Admitting: Emergency Medicine

## 2022-10-13 ENCOUNTER — Encounter (HOSPITAL_COMMUNITY): Payer: Self-pay

## 2022-10-13 DIAGNOSIS — K047 Periapical abscess without sinus: Secondary | ICD-10-CM

## 2022-10-13 MED ORDER — AMOXICILLIN-POT CLAVULANATE 875-125 MG PO TABS
1.0000 | ORAL_TABLET | Freq: Two times a day (BID) | ORAL | 0 refills | Status: AC
  Filled 2022-10-13: qty 14, 7d supply, fill #0

## 2022-10-13 NOTE — ED Provider Notes (Signed)
[place]    CSN: [id number] Arrival date & time: 10/13/22  [id number]      History   Chief Complaint Chief Complaint  Patient presents with   Dental Pain    HPI Seth Harris is a 47 y.o. male.  Medical interpretor used. Here with dental problem Lower front tooth has pus drainage from the gum. On and off for about 6 months. Has been seen by his dentist for this.  Had scans of the gum about 3-4 weeks ago but was not put on abx because drainage wasn't bad. It has worsened over the last few days. He denies pain, more of a discomfort in the gums. Dentist is referring him to a "gum specialist"  No fevers, sore throat, jaw pain.  Past Medical History:  Diagnosis Date   Allergy    Environmental allergies    Hypertension     Patient Active Problem List   Diagnosis Date Noted   Bilateral knee pain 08/12/2016   Bilateral inguinal hernia without obstruction or gangrene 05/22/2014   History of inguinal hernia repair 05/06/2014   Dyspepsia 03/27/2014   Acid reflux 12/31/2013   Inguinal pain 12/21/2013   Back strain 12/21/2013   RUQ pain 12/21/2013    Past Surgical History:  Procedure Laterality Date   HERNIA REPAIR      Home Medications    Prior to Admission medications   Medication Sig Start Date End Date Taking? Authorizing Provider  amoxicillin-clavulanate (AUGMENTIN) 875-125 MG tablet Take 1 tablet by mouth 2 (two) times daily for 7 days. Dos veces al dia durante 7 dias (una semana) 10/13/22 10/20/22 Yes Karson Reede, Wells Guiles, PA-C  amLODipine (NORVASC) 2.5 MG tablet Take 1 tablet (2.5 mg total) by mouth daily. 06/01/22   Charlott Rakes, MD  sodium fluoride (PREVIDENT 5000 PLUS) 1.1 % CREA dental cream USE AS DIRECTED:Brush in the morning and at bedtime. 10/12/22       Family History Family History  Problem Relation Age of Onset   Diabetes Mother    Diabetes Brother     Social History Social History   Tobacco Use   Smoking status: Former     Types: Cigarettes    Quit date: 11/09/2001    Years since quitting: 20.9   Smokeless tobacco: Never  Vaping Use   Vaping Use: Never used  Substance Use Topics   Alcohol use: No   Drug use: No     Allergies   Patient has no known allergies.   Review of Systems Review of Systems As per HPI  Physical Exam Triage Vital Signs ED Triage Vitals  Enc Vitals Group     BP 10/13/22 0833 131/67     Pulse Rate 10/13/22 0833 67     Resp 10/13/22 0833 18     Temp 10/13/22 0833 98.1 F (36.7 C)     Temp Source 10/13/22 0833 Oral     SpO2 10/13/22 0833 98 %     Weight --      Height --      Head Circumference --      Peak Flow --      Pain Score 10/13/22 0835 1     Pain Loc --      Pain Edu? --      Excl. in [place]? --    No data found.  Updated Vital Signs BP 131/67 (BP Location: Left Arm)   Pulse 67   Temp 98.1 F (36.7 C) (Oral)   Resp  18   SpO2 98%    Physical Exam Vitals and nursing note reviewed.  Constitutional:      General: He is not in acute distress. HENT:     Head:     Jaw: There is normal jaw occlusion.     Mouth/Throat:     Mouth: Mucous membranes are moist.     Dentition: Gingival swelling and dental abscesses present. No dental tenderness.     Pharynx: Oropharynx is clear. No posterior oropharyngeal erythema.      Comments: Purulent drainage from the lower front gums.  Cardiovascular:     Rate and Rhythm: Normal rate and regular rhythm.     Heart sounds: Normal heart sounds.  Pulmonary:     Effort: Pulmonary effort is normal.     Breath sounds: Normal breath sounds.  Musculoskeletal:     Cervical back: Normal range of motion.  Neurological:     Mental Status: He is alert and oriented to person, place, and time.      UC Treatments / Results  Labs (all labs ordered are listed, but only abnormal results are displayed) Labs Reviewed - No data to display  EKG   Radiology No results found.  Procedures Procedures (including critical  care time)  Medications Ordered in UC Medications - No data to display  Initial Impression / Assessment and Plan / UC Course  I have reviewed the triage vital signs and the nursing notes.  Pertinent labs & imaging results that were available during my care of the patient were reviewed by me and considered in my medical decision making (see chart for details).  Dental/gum infection  Augmentin BID x 7 days He will follow closely with his dentist.  ED precautions for worsening symptoms.  Final Clinical Impressions(s) / UC Diagnoses   Final diagnoses:  Dental abscess     Discharge Instructions      tome el medicamento segn lo recetado. terminar todas las pastillas. Tmelo con alimentos para Materials engineer. Llame a su dentista y vea si pueden adelantar su cita para una nueva evaluacin.     ED Prescriptions     Medication Sig Dispense Auth. Provider   amoxicillin-clavulanate (AUGMENTIN) 875-125 MG tablet Take 1 tablet by mouth 2 (two) times daily for 7 days. Dos veces al dia durante 7 dias (una semana) 14 tablet [place], Wells Guiles, [place]      PDMP not reviewed this encounter.   [place], [place] 10/13/22 [id number]

## 2022-10-13 NOTE — Discharge Instructions (Addendum)
tome el medicamento segn lo recetado. terminar todas las pastillas. Tmelo con alimentos para Materials engineer. Llame a su dentista y vea si pueden adelantar su cita para una nueva evaluacin.

## 2022-10-13 NOTE — ED Triage Notes (Signed)
Pt c/o rt lower toothache with drainage when pressing on it for the past 2-3 week. States using mouth wash with little relief.

## 2022-10-29 ENCOUNTER — Ambulatory Visit: Payer: Self-pay | Attending: Family Medicine

## 2022-11-24 ENCOUNTER — Other Ambulatory Visit: Payer: Self-pay

## 2022-11-26 ENCOUNTER — Other Ambulatory Visit: Payer: Self-pay

## 2022-12-02 ENCOUNTER — Ambulatory Visit: Payer: Self-pay | Attending: Family Medicine | Admitting: Family Medicine

## 2022-12-02 ENCOUNTER — Other Ambulatory Visit: Payer: Self-pay

## 2022-12-02 VITALS — BP 133/89 | HR 62 | Ht 72.0 in | Wt 237.2 lb

## 2022-12-02 DIAGNOSIS — R7303 Prediabetes: Secondary | ICD-10-CM

## 2022-12-02 DIAGNOSIS — Z1211 Encounter for screening for malignant neoplasm of colon: Secondary | ICD-10-CM

## 2022-12-02 DIAGNOSIS — I10 Essential (primary) hypertension: Secondary | ICD-10-CM

## 2022-12-02 LAB — POCT GLYCOSYLATED HEMOGLOBIN (HGB A1C): HbA1c, POC (prediabetic range): 5.9 % (ref 5.7–6.4)

## 2022-12-02 MED ORDER — AMLODIPINE BESYLATE 2.5 MG PO TABS
2.5000 mg | ORAL_TABLET | Freq: Every day | ORAL | 1 refills | Status: DC
  Filled 2022-12-02 – 2023-03-03 (×2): qty 90, 90d supply, fill #0
  Filled 2023-06-23 (×2): qty 90, 90d supply, fill #1

## 2022-12-02 NOTE — Progress Notes (Signed)
Subjective:  Patient ID: Seth Harris, male    DOB: 01/20/1976  Age: 47 y.o. MRN: [id number]  CC: Hypertension   HPI [facility] Seth Harris is a 47 y.o. year old male with a history of hypertension, prediabetes here for an office visit.  Interval History:  A1c is 5.9 and he is on diet control for management of his prediabetes. Also on amlodipine for his hypertension which he is tolerating well. Outside of work he does not get exercise much but his job is very physical. Endorses adherence with a low-sodium diet and denies presence of chest pain or dyspnea. He has no additional concerns today.  Past Medical History:  Diagnosis Date   Allergy    Environmental allergies    Hypertension     Past Surgical History:  Procedure Laterality Date   HERNIA REPAIR      Family History  Problem Relation Age of Onset   Diabetes Mother    Diabetes Brother     Social History   Socioeconomic History   Marital status: Married    Spouse name: Not on file   Number of children: Not on file   Years of education: Not on file   Highest education level: Not on file  Occupational History   Not on file  Tobacco Use   Smoking status: Former    Types: Cigarettes    Quit date: 11/09/2001    Years since quitting: 21.0   Smokeless tobacco: Never  Vaping Use   Vaping Use: Never used  Substance and Sexual Activity   Alcohol use: No   Drug use: No   Sexual activity: Yes    Birth control/protection: None  Other Topics Concern   Not on file  Social History Narrative   Not on file   Social Determinants of Health   Financial Resource Strain: Not on file  Food Insecurity: Not on file  Transportation Needs: Not on file  Physical Activity: Not on file  Stress: Not on file  Social Connections: Not on file    No Known Allergies  Outpatient Medications Prior to Visit  Medication Sig Dispense Refill   amLODipine (NORVASC) 2.5 MG tablet Take 1 tablet (2.5 mg  total) by mouth daily. 90 tablet 1   sodium fluoride (PREVIDENT 5000 PLUS) 1.1 % CREA dental cream USE AS DIRECTED:Brush in the morning and at bedtime. (Patient not taking: Reported on 12/02/2022) 51 g 5   No facility-administered medications prior to visit.     ROS Review of Systems  Constitutional:  Negative for activity change and appetite change.  HENT:  Negative for sinus pressure and sore throat.   Respiratory:  Negative for chest tightness, shortness of breath and wheezing.   Cardiovascular:  Negative for chest pain and palpitations.  Gastrointestinal:  Negative for abdominal distention, abdominal pain and constipation.  Genitourinary: Negative.   Musculoskeletal: Negative.   Psychiatric/Behavioral:  Negative for behavioral problems and dysphoric mood.     Objective:  BP 133/89   Pulse 62   Ht 6' (1.829 m)   Wt 237 lb 3.2 oz (107.6 kg)   SpO2 98%   BMI 32.17 kg/m      12/02/2022    8:49 AM 10/13/2022    8:33 AM 06/01/2022    8:56 AM  BP/Weight  Systolic BP 133 131 138  Diastolic BP 89 67 90  Wt. (Lbs) 237.2    BMI 32.17 kg/m2        Physical Exam Constitutional:  Appearance: He is well-developed.  Cardiovascular:     Rate and Rhythm: Normal rate.     Heart sounds: Normal heart sounds. No murmur heard. Pulmonary:     Effort: Pulmonary effort is normal.     Breath sounds: Normal breath sounds. No wheezing or rales.  Chest:     Chest wall: No tenderness.  Abdominal:     General: Bowel sounds are normal. There is no distension.     Palpations: Abdomen is soft. There is no mass.     Tenderness: There is no abdominal tenderness.  Musculoskeletal:        General: Normal range of motion.     Right lower leg: No edema.     Left lower leg: No edema.  Neurological:     Mental Status: He is alert and oriented to person, place, and time.  Psychiatric:        Mood and Affect: Mood normal.        Latest Ref Rng & Units 06/01/2022    9:03 AM 05/28/2021     8:59 AM 11/25/2020    2:06 PM  CMP  Glucose 70 - 99 mg/dL 295  621  88   BUN 6 - 24 mg/dL 14  12  11    Creatinine 0.76 - 1.27 mg/dL 3.08  6.57  8.46   Sodium 134 - 144 mmol/L 142  141  140   Potassium 3.5 - 5.2 mmol/L 4.4  4.4  4.3   Chloride 96 - 106 mmol/L 104  102  102   CO2 20 - 29 mmol/L 22  25  24    Calcium 8.7 - 10.2 mg/dL 9.3  9.2  96.2   Total Protein 6.0 - 8.5 g/dL 6.9  7.4    Total Bilirubin 0.0 - 1.2 mg/dL 0.3  0.4    Alkaline Phos 44 - 121 IU/L 73  68    AST 0 - 40 IU/L 23  26    ALT 0 - 44 IU/L 33  37      Lipid Panel     Component Value Date/Time   CHOL 143 06/01/2022 0903   TRIG 138 06/01/2022 0903   HDL 38 (L) 06/01/2022 0903   CHOLHDL 4.9 04/23/2020 0838   CHOLHDL 3.4 08/26/2016 0910   VLDL 27 08/31/2015 0955   LDLCALC 81 06/01/2022 0903    CBC    Component Value Date/Time   WBC 5.7 06/01/2022 0903   WBC 4.6 08/26/2016 0910   RBC 5.51 06/01/2022 0903   RBC 5.55 08/26/2016 0910   HGB 16.4 06/01/2022 0903   HCT 48.7 06/01/2022 0903   PLT 161 06/01/2022 0903   MCV 88 06/01/2022 0903   MCH 29.8 06/01/2022 0903   MCH 30.1 08/26/2016 0910   MCHC 33.7 06/01/2022 0903   MCHC 34.1 08/26/2016 0910   RDW 12.5 06/01/2022 0903   LYMPHSABS 2.4 06/01/2022 0903   MONOABS 276 08/26/2016 0910   EOSABS 0.2 06/01/2022 0903   BASOSABS 0.1 06/01/2022 0903    Lab Results  Component Value Date   HGBA1C 5.9 12/02/2022    Assessment & Plan:   1. Essential hypertension Show Continue current regimen Counseled on blood pressure goal of less than 130/80, low-sodium, DASH diet, medication compliance, 150 minutes of moderate intensity exercise per week. Discussed medication compliance, adverse effects. - amLODipine (NORVASC) 2.5 MG tablet; Take 1 tablet (2.5 mg total) by mouth daily.  Dispense: 90 tablet; Refill: 1 - LP+Non-HDL Cholesterol - CMP14+EGFR - CBC with  Differential/Platelet  2. Screening for colon cancer - Fecal occult blood, imunochemical  3.  Prediabetes Labs reveal prediabetes with an A1c of 5.9.  Working on a low carbohydrate diet, exercise, weight loss is recommended in order to prevent progression to type 2 diabetes mellitus.    Meds ordered this encounter  Medications   amLODipine (NORVASC) 2.5 MG tablet    Sig: Take 1 tablet (2.5 mg total) by mouth daily.    Dispense:  90 tablet    Refill:  1    Follow-up: Return in about 6 months (around 06/04/2023) for Chronic medical conditions.       Hoy Register, MD, FAAFP. [facility] [place], [place] [phone]   12/02/2022, 9:32 AM

## 2022-12-02 NOTE — Patient Instructions (Signed)
Hacer ejercicio para mantenerse sano Exercising to Stay Healthy Para estar sano y mantenerse as, es recomendable hacer ejercicio de intensidad moderada y de intensidad vigorosa. Puede saber si est haciendo ejercicio de intensidad moderada si su corazn comienza a latir ms rpido y su respiracin se vuelve ms rpida, pero an puede mantener una conversacin. Puede saber que est haciendo ejercicio de intensidad vigorosa si respira con mucha ms dificultad y rapidez, y no puede mantener una conversacin. Cmo puede beneficiarme el ejercicio? Hacer actividad fsica con regularidad es muy importante. Tiene muchos otros beneficios, como por ejemplo: Mejora el estado fsico general, la flexibilidad y la resistencia. Aumenta la densidad sea. Ayuda a controlar el peso. Disminuye la grasa corporal. Aumenta la fuerza y la resistencia muscular. Reduce el estrs y la tensin, la ansiedad, la depresin o la ira. Mejora el estado de salud general. Qu pautas debo seguir mientras hago ejercicio? Hable con el mdico antes de comenzar un programa nuevo de actividad fsica. No haga ejercicio en exceso que pudiera hacer que se lastime, se sienta mareado o tenga dificultad para respirar. Use ropa cmoda y calzado con buen soporte. Beba gran cantidad de agua mientras hace ejercicio para evitar la deshidratacin o los golpes de calor. Haga ejercicio hasta que se aceleren su respiracin y sus latidos cardacos (intensidad moderada). Con qu frecuencia debera hacer ejercicio? Elija una actividad que disfrute y establezca objetivos realistas. El mdico puede ayudarlo a elaborar un plan de actividades, diseado de manera individual y que funcione mejor para usted. Haga actividad fsica habitualmente como se lo haya indicado el mdico. Esto puede incluir: Hacer ejercicios de fortalecimiento muscular dos veces a la semana, como: Levantamiento de pesas. Uso de bandas elsticas de resistencia. Flexiones de  brazos. Abdominales. Yoga. Realizar ejercicio de una cierta intensidad durante una cantidad determinada de tiempo. Elija entre estas opciones: Un total de 150 minutos de ejercicio de intensidad moderada cada semana. Un total de 75 minutos de ejercicio de intensidad vigorosa cada semana. Una mezcla de ejercicio de intensidad moderada y vigorosa cada semana. Los nios, las mujeres embarazadas, las personas que no han hecho actividad fsica con regularidad, las personas que tienen sobrepeso y los adultos mayores tal vez tengan que consultar a un mdico sobre qu actividades son seguras para realizar. Si tiene alguna afeccin, asegrese de consultar al mdico antes de comenzar un programa de ejercicios nuevo. Cules son algunas ideas para hacer ejercicio? Algunas ideas de ejercicio de intensidad moderada incluyen: Caminar 1 milla (1.6 km) en aproximadamente 15 minutos. Andar en bicicleta. Practicar senderismo. Jugar al golf. Bailar. Gimnasia acutica. Algunas ideas de ejercicio de intensidad vigorosa incluyen: Caminar 4.5 millas (7.2 km) o ms en aproximadamente una hora. Trotar o correr 5 millas (8 km) en aproximadamente una hora. Andar en bicicleta 10 millas (16.1 km) o ms en aproximadamente una hora. Practicar natacin. Practicar patinaje de ruedas o en lnea. Hacer esqu de fondo. Hacer deportes competitivos vigorosos, como ftbol americano, bsquet y ftbol. Saltar la cuerda. Tomar clases de baile aerbico. Cules son algunas actividades diarias que pueden ayudarme a hacer ejercicio? Trabajar en el jardn, como: Empujar una cortadora de csped. Juntar y embolsar hojas. Lavar el automvil. Empujar un cochecito. Palear nieve. Cuidar el jardn. Lavar las ventanas o los pisos. Cmo puedo ser ms activo en mis actividades diarias? Utilice las escaleras en lugar del ascensor. Vaya a caminar durante su hora de almuerzo. Si conduce, estacione el automvil ms lejos del trabajo o de  la escuela. Si usa transporte   pblico, bjese una parada antes y camine el resto del camino. Pngase de pie o camine durante todas las llamadas telefnicas que haga mientras est adentro. Levntese, estrese y camine cada 30 minutos a lo largo del da. Haga ejercicio con un amigo. El apoyo para continuar haciendo ejercicio lo ayudar a mantener una rutina de actividad frecuente. Dnde obtener ms informacin Puede obtener ms informacin acerca de cmo hacer actividad fsica para mantenerse sano en: U.S. Department of Health and Human Services (Departamento de Salud y Servicios Humanos de los [place] Un[place]): www.hhs.gov Centers for Disease Control and Prevention (CDC) (Centros para el Control y la Prevencin de Enfermedades): www.cdc.gov Resumen Hacer actividad fsica con regularidad es muy importante. Mejorar el estado fsico general, la flexibilidad y la resistencia. El ejercicio regular tambin mejorar la salud general. Puede ayudarlo a controlar su peso, reducir el estrs y mejorar la densidad sea. No haga ejercicio en exceso que pudiera hacer que se lastime, se sienta mareado o tenga dificultad para respirar. Hable con el mdico antes de comenzar un programa nuevo de actividad fsica. Esta informacin no tiene como fin reemplazar el consejo del mdico. Asegrese de hacerle al mdico cualquier pregunta que tenga. Document Revised: 11/13/2020 Document Reviewed: 11/13/2020 Elsevier Patient Education  2023 Elsevier Inc.  

## 2022-12-03 LAB — CMP14+EGFR
ALT: 29 IU/L (ref 0–44)
AST: 25 IU/L (ref 0–40)
Albumin/Globulin Ratio: 1.8 (ref 1.2–2.2)
Albumin: 4.5 g/dL (ref 4.1–5.1)
Alkaline Phosphatase: 76 IU/L (ref 44–121)
BUN/Creatinine Ratio: 13 (ref 9–20)
BUN: 12 mg/dL (ref 6–24)
Bilirubin Total: 0.4 mg/dL (ref 0.0–1.2)
CO2: 22 mmol/L (ref 20–29)
Calcium: 9.1 mg/dL (ref 8.7–10.2)
Chloride: 104 mmol/L (ref 96–106)
Creatinine, Ser: 0.89 mg/dL (ref 0.76–1.27)
Globulin, Total: 2.5 g/dL (ref 1.5–4.5)
Glucose: 109 mg/dL — ABNORMAL HIGH (ref 70–99)
Potassium: 4.3 mmol/L (ref 3.5–5.2)
Sodium: 139 mmol/L (ref 134–144)
Total Protein: 7 g/dL (ref 6.0–8.5)
eGFR: 105 mL/min/{1.73_m2} (ref >59)

## 2022-12-03 LAB — CBC WITH DIFFERENTIAL/PLATELET
Basophils Absolute: 0 10*3/uL (ref 0.0–0.2)
Basos: 1 %
EOS (ABSOLUTE): 0.1 10*3/uL (ref 0.0–0.4)
Eos: 3 %
Hematocrit: 48.1 % (ref 37.5–51.0)
Hemoglobin: 16.2 g/dL (ref 13.0–17.7)
Immature Grans (Abs): 0 10*3/uL (ref 0.0–0.1)
Immature Granulocytes: 1 %
Lymphocytes Absolute: 2.1 10*3/uL (ref 0.7–3.1)
Lymphs: 40 %
MCH: 29.1 pg (ref 26.6–33.0)
MCHC: 33.7 g/dL (ref 31.5–35.7)
MCV: 87 fL (ref 79–97)
Monocytes Absolute: 0.4 10*3/uL (ref 0.1–0.9)
Monocytes: 8 %
Neutrophils Absolute: 2.5 10*3/uL (ref 1.4–7.0)
Neutrophils: 47 %
Platelets: 159 10*3/uL (ref 150–450)
RBC: 5.56 x10E6/uL (ref 4.14–5.80)
RDW: 12.4 % (ref 11.6–15.4)
WBC: 5.2 10*3/uL (ref 3.4–10.8)

## 2022-12-03 LAB — LP+NON-HDL CHOLESTEROL
Cholesterol, Total: 147 mg/dL (ref 100–199)
HDL: 39 mg/dL — ABNORMAL LOW (ref >39)
LDL Chol Calc (NIH): 86 mg/dL (ref 0–99)
Total Non-HDL-Chol (LDL+VLDL): 108 mg/dL (ref 0–129)
Triglycerides: 119 mg/dL (ref 0–149)
VLDL Cholesterol Cal: 22 mg/dL (ref 5–40)

## 2022-12-06 ENCOUNTER — Other Ambulatory Visit: Payer: Self-pay

## 2022-12-14 ENCOUNTER — Other Ambulatory Visit: Payer: Self-pay

## 2022-12-16 ENCOUNTER — Other Ambulatory Visit: Payer: Self-pay

## 2023-03-03 ENCOUNTER — Other Ambulatory Visit: Payer: Self-pay

## 2023-06-06 ENCOUNTER — Ambulatory Visit: Payer: Self-pay | Admitting: Family Medicine

## 2023-06-23 ENCOUNTER — Other Ambulatory Visit: Payer: Self-pay

## 2023-06-24 ENCOUNTER — Other Ambulatory Visit: Payer: Self-pay

## 2023-06-28 ENCOUNTER — Other Ambulatory Visit: Payer: Self-pay

## 2023-06-30 ENCOUNTER — Other Ambulatory Visit: Payer: Self-pay

## 2023-08-17 ENCOUNTER — Encounter (HOSPITAL_COMMUNITY): Payer: Self-pay

## 2023-08-17 ENCOUNTER — Ambulatory Visit (HOSPITAL_COMMUNITY)
Admission: EM | Admit: 2023-08-17 | Discharge: 2023-08-17 | Disposition: A | Discharge status: Home / Self Care | Payer: Self-pay | Attending: Sports Medicine | Admitting: Sports Medicine

## 2023-08-17 DIAGNOSIS — T148XXA Other injury of unspecified body region, initial encounter: Secondary | ICD-10-CM

## 2023-08-17 NOTE — ED Provider Notes (Signed)
[facility]    CSN: [id number] Arrival date & time: 08/17/23  0801      History   Chief Complaint Chief Complaint  Patient presents with   Back Pain    HPI [name] is a 48 y.o. male.   Seth Harris is a 48yo male here for evaluation of right midback pain over the last 10 days.  He does not recall an initial injury though does work in [employer] and has had difficulty with lifting items and twisting his back.  Pain is reproducible to palpation over the right mid back and with certain positions.  He denies fevers, dysuria, hematuria, urinary frequency or urgency, constipation, blood in his stools, nausea.  He does endorse some mild epigastric discomfort that he states is chronic and relates this to his history of acid reflux.  Denies radiating pain or numbness/tingling down either of his legs.  He has been taking ibuprofen which has been helpful for the pain.  His biggest concern today is to check to see if his pain is more muscular versus something intra-abdominal.  He has no history of kidney stones.  The history is provided by the patient. The history is limited by a language barrier. A language interpreter was used ([employer] video interpreter services).  Back Pain   Past Medical History:  Diagnosis Date   Allergy    Environmental allergies    Hypertension     Patient Active Problem List   Diagnosis Date Noted   Prediabetes 12/02/2022   Bilateral knee pain 08/12/2016   Bilateral inguinal hernia without obstruction or gangrene 05/22/2014   History of inguinal hernia repair 05/06/2014   Dyspepsia 03/27/2014   Acid reflux 12/31/2013   Inguinal pain 12/21/2013   Back strain 12/21/2013   RUQ pain 12/21/2013    Past Surgical History:  Procedure Laterality Date   HERNIA REPAIR         Home Medications    Prior to Admission medications   Medication Sig Start Date End Date Taking? Authorizing Provider  amLODipine (NORVASC) 2.5 MG tablet Take  1 tablet (2.5 mg total) by mouth daily. 12/02/22   Hoy Register, MD  sodium fluoride (PREVIDENT 5000 PLUS) 1.1 % CREA dental cream USE AS DIRECTED:Brush in the morning and at bedtime. Patient not taking: Reported on 12/02/2022 10/12/22       Family History Family History  Problem Relation Age of Onset   Diabetes Mother    Diabetes Brother     Social History Social History   Tobacco Use   Smoking status: Former    Current packs/day: 0.00    Types: Cigarettes    Quit date: 11/09/2001    Years since quitting: 21.7   Smokeless tobacco: Never  Vaping Use   Vaping status: Never Used  Substance Use Topics   Alcohol use: No   Drug use: No     Allergies   Patient has no known allergies.   Review of Systems Review of Systems  Musculoskeletal:  Positive for back pain.     Physical Exam Triage Vital Signs ED Triage Vitals [08/17/23 0822]  Encounter Vitals Group     BP (!) 150/79     Systolic BP Percentile      Diastolic BP Percentile      Pulse Rate 72     Resp 18     Temp 98.9 F (37.2 C)     Temp Source Oral     SpO2 96 %  Weight      Height      Head Circumference      Peak Flow      Pain Score 3     Pain Loc      Pain Education      Exclude from Growth Chart    No data found.  Updated Vital Signs BP (!) 150/79 (BP Location: Right Arm)   Pulse 72   Temp 98.9 F (37.2 C) (Oral)   Resp 18   SpO2 96%   Visual Acuity Right Eye Distance:   Left Eye Distance:   Bilateral Distance:    Right Eye Near:   Left Eye Near:    Bilateral Near:     Physical Exam Constitutional:      General: He is not in acute distress.    Appearance: Normal appearance. He is obese. He is not toxic-appearing.  HENT:     Head: Normocephalic and atraumatic.  Eyes:     Extraocular Movements: Extraocular movements intact.     Conjunctiva/sclera: Conjunctivae normal.     Pupils: Pupils are equal, round, and reactive to light.  Cardiovascular:     Rate and Rhythm: Normal  rate and regular rhythm.     Pulses: Normal pulses.     Heart sounds: No murmur heard. Pulmonary:     Effort: Pulmonary effort is normal. No respiratory distress.     Breath sounds: Normal breath sounds.  Abdominal:     General: Bowel sounds are normal. There is no distension.     Palpations: Abdomen is soft.     Tenderness: There is abdominal tenderness. There is no right CVA tenderness, left CVA tenderness, guarding or rebound.     Comments: Mild epigastric tenderness to palpation, otherwise abdomen is nontender to palpation  Musculoskeletal:     Cervical back: Normal range of motion and neck supple.     Comments: Lumbar and thoracic spine range of motion is full though he does have some discomfort with oblique twisting.  Reproducible tenderness to palpation over the right paraspinal muscles between T9 and T11 with some reproducible tenderness laterally to about the mid axillary line.  No tenderness to palpation lumbar spine.  Lower extremity strength is full.  Normal sensation in bilateral lower extremities.  Skin:    General: Skin is warm and dry.     Capillary Refill: Capillary refill takes less than 2 seconds.     Comments: No rashes on abdomen or mid back  Neurological:     General: No focal deficit present.     Mental Status: He is alert and oriented to person, place, and time.  Psychiatric:        Mood and Affect: Mood normal.        Behavior: Behavior normal.      UC Treatments / Results  Labs (all labs ordered are listed, but only abnormal results are displayed) Labs Reviewed - No data to display  EKG   Radiology No results found.  Procedures Procedures (including critical care time)  Medications Ordered in UC Medications - No data to display  Initial Impression / Assessment and Plan / UC Course  I have reviewed the triage vital signs and the nursing notes.  Pertinent labs & imaging results that were available during my care of the patient were reviewed by  me and considered in my medical decision making (see chart for details).    Vitals and triage reviewed, patient is hemodynamically stable.  Patient has a muscle  strain of his low thoracic spine. His physical exam is largely reassuring though does have some chronic epigastric tenderness to palpation which is likely related to his reflux/gastritis.  Advised him to rest, do some lumbar mid back stretches, continue Tylenol and ibuprofen on as-needed basis, and use heat and ice alternatively whichever is more helpful. Patient's questions were answered and they are in agreement with this plan.  Final Clinical Impressions(s) / UC Diagnoses   Final diagnoses:  Muscle strain     Discharge Instructions      Tiene dolor en la parte media de la espalda debido a una distensin muscular.  Para optimizar el manejo y la funcin del dolor, utilizaremos un enfoque multidireccional para Scientist, research (medical).  Para abordar el dolor directamente: Utilice calor o hielo, lo que sea ms efectivo para usted.  Para abordar el dolor sistmicamente:  Puede tomar Tylenol 500 mg 1-2 comprimidos hasta tres veces al [employer]. Comience con la dosis y frecuencia ms bajas y vaya aumentando hasta que controle el dolor. No exceda los 1000 mg tres veces al [employer]. Entre dosis de Tylenol puede utilizar ibuprofeno 400-600 mg cada 8 horas. Cualquiera que elijas, tmalo con comida y [place].   Para abordar la disfuncin subyacente:  Comience ejercicios suaves de rango de movimiento.  Si esto no es efectivo, puede beneficiarse de la fisioterapia formal.  Para obtener mejores posibilidades de un control duradero del Engineer, mining, es mejor utilizar varias de estas terapias juntas en lugar de probar cada una por separado.    ED Prescriptions   None    PDMP not reviewed this encounter.   Marisa Cyphers, MD 08/17/23 [phone]

## 2023-08-17 NOTE — ED Triage Notes (Signed)
Pt c/o rt mid back back radiating around to abdomen for a week and a half. States pain when moving or carrying/lifting something. States took ibuprofen with relief. Denies known injury.

## 2023-08-17 NOTE — Discharge Instructions (Addendum)
Tiene dolor en la parte media de la espalda debido a una distensin muscular.  Para optimizar el manejo y la funcin del dolor, utilizaremos un enfoque multidireccional para Scientist, research (medical).  Para abordar el dolor directamente: Utilice calor o hielo, lo que sea ms efectivo para usted.  Para abordar el dolor sistmicamente:  Puede tomar Tylenol 500 mg 1-2 comprimidos hasta tres veces al [employer]. Comience con la dosis y frecuencia ms bajas y vaya aumentando hasta que controle el dolor. No exceda los 1000 mg tres veces al [employer]. Entre dosis de Tylenol puede utilizar ibuprofeno 400-600 mg cada 8 horas. Cualquiera que elijas, tmalo con comida y [place].   Para abordar la disfuncin subyacente:  Comience ejercicios suaves de rango de movimiento.  Si esto no es efectivo, puede beneficiarse de la fisioterapia formal.  Para obtener mejores posibilidades de un control duradero del Engineer, mining, es mejor utilizar varias de estas terapias juntas en lugar de probar cada una por separado.

## 2023-09-10 ENCOUNTER — Ambulatory Visit (HOSPITAL_COMMUNITY)
Admission: EM | Admit: 2023-09-10 | Discharge: 2023-09-10 | Disposition: A | Discharge status: Home / Self Care | Payer: Self-pay | Attending: Emergency Medicine | Admitting: Emergency Medicine

## 2023-09-10 ENCOUNTER — Other Ambulatory Visit: Payer: Self-pay

## 2023-09-10 ENCOUNTER — Encounter (HOSPITAL_COMMUNITY): Payer: Self-pay | Admitting: Emergency Medicine

## 2023-09-10 DIAGNOSIS — R1011 Right upper quadrant pain: Secondary | ICD-10-CM

## 2023-09-10 DIAGNOSIS — M546 Pain in thoracic spine: Secondary | ICD-10-CM

## 2023-09-10 LAB — POCT URINALYSIS DIP (MANUAL ENTRY)
Bilirubin, UA: NEGATIVE
Blood, UA: NEGATIVE
Glucose, UA: NEGATIVE mg/dL
Ketones, POC UA: NEGATIVE mg/dL
Leukocytes, UA: NEGATIVE
Nitrite, UA: NEGATIVE
Protein Ur, POC: NEGATIVE mg/dL
Spec Grav, UA: 1.02 (ref 1.010–1.025)
Urobilinogen, UA: 0.2 U/dL
pH, UA: 7 (ref 5.0–8.0)

## 2023-09-10 MED ORDER — CYCLOBENZAPRINE HCL 10 MG PO TABS
10.0000 mg | ORAL_TABLET | Freq: Two times a day (BID) | ORAL | 0 refills | Status: DC | PRN
  Filled 2023-09-10: qty 20, 10d supply, fill #0

## 2023-09-10 NOTE — ED Provider Notes (Signed)
 [facility]    CSN: [id number] Arrival date & time: 09/10/23  1006      History   Chief Complaint Chief Complaint  Patient presents with   Back Pain   Dysuria    HPI [facility] Seth Harris is a 48 y.o. male.  Here with continued right sided pain Feels it starts RUQ and radiates to right back At rest is rating 4/10. When it flares it becomes around an 8. Not sure what worsens the pain; maybe stretching. Abdominal pain does not occur with eating When he takes ibuprofen, pain resolves   Seen 1/29, dx with muscle strain, advised ibuprofen/tylenol and heat, stretches. Patient returns with same pain  No numbness or tingling of extremities No bladder or bowel dysfunction, although he did report dysuria once yesterday. No hematuria or fevers.  Works in [employer]   Past Medical History:  Diagnosis Date   Allergy    Environmental allergies    Hypertension     Patient Active Problem List   Diagnosis Date Noted   Prediabetes 12/02/2022   Bilateral knee pain 08/12/2016   Bilateral inguinal hernia without obstruction or gangrene 05/22/2014   History of inguinal hernia repair 05/06/2014   Dyspepsia 03/27/2014   Acid reflux 12/31/2013   Inguinal pain 12/21/2013   Back strain 12/21/2013   RUQ pain 12/21/2013    Past Surgical History:  Procedure Laterality Date   HERNIA REPAIR         Home Medications    Prior to Admission medications   Medication Sig Start Date End Date Taking? Authorizing Provider  cyclobenzaprine (FLEXERIL) 10 MG tablet Take 1 tablet (10 mg total) by mouth 2 (two) times daily as needed for muscle spasms. 09/10/23  Yes Maikol Grassia, Lurena Joiner, PA-C  amLODipine (NORVASC) 2.5 MG tablet Take 1 tablet (2.5 mg total) by mouth daily. 12/02/22   Hoy Register, MD  sodium fluoride (PREVIDENT 5000 PLUS) 1.1 % CREA dental cream USE AS DIRECTED:Brush in the morning and at bedtime. Patient not taking: Reported on 12/02/2022 10/12/22        Family History Family History  Problem Relation Age of Onset   Diabetes Mother    Diabetes Brother     Social History Social History   Tobacco Use   Smoking status: Former    Current packs/day: 0.00    Types: Cigarettes    Quit date: 11/09/2001    Years since quitting: 21.8   Smokeless tobacco: Never  Vaping Use   Vaping status: Never Used  Substance Use Topics   Alcohol use: No   Drug use: No     Allergies   Patient has no known allergies.   Review of Systems Review of Systems Per HPI  Physical Exam Triage Vital Signs ED Triage Vitals [09/10/23 1021]  Encounter Vitals Group     BP (!) 151/85     Systolic BP Percentile      Diastolic BP Percentile      Pulse Rate 73     Resp 18     Temp 97.9 F (36.6 C)     Temp Source Oral     SpO2 98 %     Weight      Height      Head Circumference      Peak Flow      Pain Score      Pain Loc      Pain Education      Exclude from Growth Chart    No  data found.  Updated Vital Signs BP (!) 151/85 (BP Location: Left Arm)   Pulse 73   Temp 97.9 F (36.6 C) (Oral)   Resp 18   SpO2 98%   Physical Exam Vitals and nursing note reviewed.  Constitutional:      General: He is not in acute distress. HENT:     Mouth/Throat:     Pharynx: Oropharynx is clear.  Cardiovascular:     Rate and Rhythm: Normal rate and regular rhythm.     Pulses: Normal pulses.  Pulmonary:     Effort: Pulmonary effort is normal.  Abdominal:     General: There is no distension.     Palpations: Abdomen is soft. There is no hepatomegaly or mass.     Tenderness: There is no abdominal tenderness. There is no right CVA tenderness, left CVA tenderness or guarding. Negative signs include Murphy's sign.  Musculoskeletal:       Arms:       Back:     Comments: Pain is described wrapping from RUQ to right back. The back is tender to palpation. There is no bony tenderness of spine. No rash or bruising is noted in area of pain. Full ROM of all  extremities, strength and sensation intact  Skin:    General: Skin is warm and dry.     Findings: No erythema, lesion or rash.  Neurological:     Mental Status: He is alert and oriented to person, place, and time.     Motor: No weakness.     Gait: Gait normal.     UC Treatments / Results  Labs (all labs ordered are listed, but only abnormal results are displayed) Labs Reviewed  POCT URINALYSIS DIP (MANUAL ENTRY)    EKG  Radiology No results found.  Procedures Procedures (including critical care time)  Medications Ordered in UC Medications - No data to display  Initial Impression / Assessment and Plan / UC Course  I have reviewed the triage vital signs and the nursing notes.  Pertinent labs & imaging results that were available during my care of the patient were reviewed by me and considered in my medical decision making (see chart for details).  UA unremarkable Stable vitals Try muscle relaxer BID, advised drowsy precautions RUQ is non tender to palpation. It seems the pain starts from here and radiates around. Pain does not occur after eating. Discussed with patient he has no red flags at this time, advised to follow with PCP. Has appointment next month, will call to move sooner if able. Strict ED precautions for any severe pain or worsening of symptoms. Reassuring ibuprofen completely resolves pain Could have muscle/cartilage inflammation from the work he does in [employer], will trial the muscle relaxer   Final Clinical Impressions(s) / UC Diagnoses   Final diagnoses:  Acute right-sided thoracic back pain  RUQ pain     Discharge Instructions      You can take the muscle relaxer (Flexeril) twice daily. If the medication makes you drowsy, take only at bed time. Continue ibuprofen up to 600 mg every 6 hours if helpful  Please monitor symptoms. If your pain becomes severe, go to the emergency department. Otherwise, call your primary care provider and try to move  your appointment sooner  Puede tomar el relajante muscular (Flexeril) dos veces al da. Si el medicamento le produce somnolencia, tmelo slo antes de acostarse. Contine con ibuprofeno hasta 600 mg cada 6 horas si es til  Por favor [employer]  sntomas. Si su dolor se vuelve intenso, acuda al servicio de [place]. De lo contrario, llame a su proveedor de atencin primaria e intente programar su cita antes.     ED Prescriptions     Medication Sig Dispense Auth. Provider   cyclobenzaprine (FLEXERIL) 10 MG tablet Take 1 tablet (10 mg total) by mouth 2 (two) times daily as needed for muscle spasms. 20 tablet Rishit Burkhalter, Lurena Joiner, PA-C      PDMP not reviewed this encounter.   Marlow Baars, [place] 09/10/23 1128

## 2023-09-10 NOTE — ED Triage Notes (Signed)
 Patient presents with c/o right sided back pain and states sometimes he has pain with urination x 3 weeks.

## 2023-09-10 NOTE — Discharge Instructions (Addendum)
 You can take the muscle relaxer (Flexeril) twice daily. If the medication makes you drowsy, take only at bed time. Continue ibuprofen up to 600 mg every 6 hours if helpful  Please monitor symptoms. If your pain becomes severe, go to the emergency department. Otherwise, call your primary care provider and try to move your appointment sooner  Puede tomar el relajante muscular (Flexeril) dos veces al da. Si el medicamento le produce somnolencia, tmelo slo antes de acostarse. Contine con ibuprofeno hasta 600 mg cada 6 horas si es til  Por favor controle los sntomas. Si su dolor se vuelve intenso, acuda al servicio de [place]. De lo contrario, llame a su proveedor de atencin primaria e intente programar su cita antes.

## 2023-09-12 ENCOUNTER — Other Ambulatory Visit: Payer: Self-pay

## 2023-09-19 ENCOUNTER — Ambulatory Visit: Payer: Self-pay | Attending: Family Medicine | Admitting: Family Medicine

## 2023-09-19 ENCOUNTER — Encounter: Payer: Self-pay | Admitting: Family Medicine

## 2023-09-19 ENCOUNTER — Other Ambulatory Visit: Payer: Self-pay

## 2023-09-19 VITALS — BP 130/86 | HR 75 | Ht 72.0 in | Wt 239.2 lb

## 2023-09-19 DIAGNOSIS — R1011 Right upper quadrant pain: Secondary | ICD-10-CM

## 2023-09-19 DIAGNOSIS — R7303 Prediabetes: Secondary | ICD-10-CM

## 2023-09-19 DIAGNOSIS — I10 Essential (primary) hypertension: Secondary | ICD-10-CM

## 2023-09-19 DIAGNOSIS — M62838 Other muscle spasm: Secondary | ICD-10-CM

## 2023-09-19 MED ORDER — AMLODIPINE BESYLATE 2.5 MG PO TABS
2.5000 mg | ORAL_TABLET | Freq: Every day | ORAL | 1 refills | Status: DC
  Filled 2023-09-19 (×2): qty 90, 90d supply, fill #0
  Filled 2024-01-04: qty 90, 90d supply, fill #1

## 2023-09-19 NOTE — Progress Notes (Signed)
 Subjective:  Patient ID: Seth Harris, male    DOB: 1976-02-01  Age: 48 y.o. MRN: [id number]  CC: Annual Exam (Back pain)     Discussed the use of AI scribe software for clinical note transcription with the patient, who gave verbal consent to proceed.  History of Present Illness Seth Harris, a 48 year old male with a history of hypertension and prediabetes, presents with right upper quadrant pain. The pain, which he describes as 'a little weird and stretched,' was initially thought to be related to a muscle spasm after an urgent care mentation 6 weeks ago. He was given a muscle relaxant, which he reports has almost completely alleviated the pain. He denies any associated nausea or vomiting. He also denies any exacerbation of the pain with certain foods, although he notes that it seemed to hurt a little more when he was eating. He denies any issues with constipation.  He has a history of hypertension, for which he is taking amlodipine 2.5mg  daily, and his blood pressure is well-controlled. He also has prediabetes.    Past Medical History:  Diagnosis Date   Allergy    Environmental allergies    Hypertension     Past Surgical History:  Procedure Laterality Date   HERNIA REPAIR      Family History  Problem Relation Age of Onset   Diabetes Mother    Diabetes Brother     Social History   Socioeconomic History   Marital status: Married    Spouse name: Not on file   Number of children: Not on file   Years of education: Not on file   Highest education level: Not on file  Occupational History   Not on file  Tobacco Use   Smoking status: Former    Current packs/day: 0.00    Types: Cigarettes    Quit date: 11/09/2001    Years since quitting: 21.8   Smokeless tobacco: Never  Vaping Use   Vaping status: Never Used  Substance and Sexual Activity   Alcohol use: No   Drug use: No   Sexual activity: Yes    Birth control/protection: None  Other Topics  Concern   Not on file  Social History Narrative   Not on file   Social Drivers of Health   Financial Resource Strain: Not on file  Food Insecurity: Not on file  Transportation Needs: Not on file  Physical Activity: Not on file  Stress: Not on file  Social Connections: Not on file    No Known Allergies  Outpatient Medications Prior to Visit  Medication Sig Dispense Refill   cyclobenzaprine (FLEXERIL) 10 MG tablet Take 1 tablet (10 mg total) by mouth 2 (two) times daily as needed for muscle spasms. 20 tablet 0   amLODipine (NORVASC) 2.5 MG tablet Take 1 tablet (2.5 mg total) by mouth daily. 90 tablet 1   sodium fluoride (PREVIDENT 5000 PLUS) 1.1 % CREA dental cream USE AS DIRECTED:Brush in the morning and at bedtime. (Patient not taking: Reported on 09/19/2023) 51 g 5   No facility-administered medications prior to visit.     ROS Review of Systems  Constitutional:  Negative for activity change and appetite change.  HENT:  Negative for sinus pressure and sore throat.   Respiratory:  Negative for chest tightness, shortness of breath and wheezing.   Cardiovascular:  Negative for chest pain and palpitations.  Gastrointestinal:  Negative for abdominal distention, abdominal pain and constipation.  Genitourinary: Negative.   Musculoskeletal: Negative.  Psychiatric/Behavioral:  Negative for behavioral problems and dysphoric mood.     Objective:  BP 130/86   Pulse 75   Ht 6' (1.829 m)   Wt 239 lb 3.2 oz (108.5 kg)   SpO2 97%   BMI 32.44 kg/m      09/19/2023   10:29 AM 09/10/2023   10:21 AM 08/17/2023    8:22 AM  BP/Weight  Systolic BP 130 151 150  Diastolic BP 86 85 79  Wt. (Lbs) 239.2    BMI 32.44 kg/m2        Physical Exam Constitutional:      Appearance: He is well-developed.  Cardiovascular:     Rate and Rhythm: Normal rate.     Heart sounds: Normal heart sounds. No murmur heard. Pulmonary:     Effort: Pulmonary effort is normal.     Breath sounds: Normal  breath sounds. No wheezing or rales.  Chest:     Chest wall: No tenderness.  Abdominal:     General: Bowel sounds are normal. There is no distension.     Palpations: Abdomen is soft. There is no mass.     Tenderness: There is abdominal tenderness (RUQ extending posteriorly).  Musculoskeletal:        General: Normal range of motion.     Right lower leg: No edema.     Left lower leg: No edema.  Neurological:     Mental Status: He is alert and oriented to person, place, and time.  Psychiatric:        Mood and Affect: Mood normal.        Latest Ref Rng & Units 12/02/2022   10:18 AM 06/01/2022    9:03 AM 05/28/2021    8:59 AM  CMP  Glucose 70 - 99 mg/dL 010  272  536   BUN 6 - 24 mg/dL 12  14  12    Creatinine 0.76 - 1.27 mg/dL 6.44  0.34  7.42   Sodium 134 - 144 mmol/L 139  142  141   Potassium 3.5 - 5.2 mmol/L 4.3  4.4  4.4   Chloride 96 - 106 mmol/L 104  104  102   CO2 20 - 29 mmol/L 22  22  25    Calcium 8.7 - 10.2 mg/dL 9.1  9.3  9.2   Total Protein 6.0 - 8.5 g/dL 7.0  6.9  7.4   Total Bilirubin 0.0 - 1.2 mg/dL 0.4  0.3  0.4   Alkaline Phos 44 - 121 IU/L 76  73  68   AST 0 - 40 IU/L 25  23  26    ALT 0 - 44 IU/L 29  33  37     Lipid Panel     Component Value Date/Time   CHOL 147 12/02/2022 1018   TRIG 119 12/02/2022 1018   HDL 39 (L) 12/02/2022 1018   CHOLHDL 4.9 04/23/2020 0838   CHOLHDL 3.4 08/26/2016 0910   VLDL 27 08/31/2015 0955   LDLCALC 86 12/02/2022 1018    CBC    Component Value Date/Time   WBC 5.2 12/02/2022 1018   WBC 4.6 08/26/2016 0910   RBC 5.56 12/02/2022 1018   RBC 5.55 08/26/2016 0910   HGB 16.2 12/02/2022 1018   HCT 48.1 12/02/2022 1018   PLT 159 12/02/2022 1018   MCV 87 12/02/2022 1018   MCH 29.1 12/02/2022 1018   MCH 30.1 08/26/2016 0910   MCHC 33.7 12/02/2022 1018   MCHC 34.1 08/26/2016 0910   RDW 12.4  12/02/2022 1018   LYMPHSABS 2.1 12/02/2022 1018   MONOABS 276 08/26/2016 0910   EOSABS 0.1 12/02/2022 1018   BASOSABS 0.0  12/02/2022 1018    Lab Results  Component Value Date   HGBA1C 5.9 12/02/2022       Assessment & Plan Muscle Spasm/ right upper quadrant pain Recent episode of muscle spasm treated with muscle relaxant. Pain has significantly improved and patient has enough medication for 3-4 more days. No need for refill. -Continue current muscle relaxant as needed. -Use heating pad, massage, and abdominal wall stretches for pain relief.- -Pain in the right upper quadrant of the abdomen that radiates to the back. No associated nausea or vomiting. Pain does not worsen with certain foods. Differential includes gallbladder disease. -Advise patient to monitor for worsening pain, nausea, vomiting, or pain that worsens with certain foods (potential signs of gallbladder disease).  Hypertension Blood pressure controlled on Amlodipine 2.5mg  daily. -Continue Amlodipine 2.5mg  daily. -Counseled on blood pressure goal of less than 130/80, low-sodium, DASH diet, medication compliance, 150 minutes of moderate intensity exercise per week. Discussed medication compliance, adverse effects.   Prediabetes No new concerns or changes. -Continue current management.  General Health Maintenance -Continue healthy lifestyle habits to manage prediabetes and hypertension.      Meds ordered this encounter  Medications   amLODipine (NORVASC) 2.5 MG tablet    Sig: Take 1 tablet (2.5 mg total) by mouth daily.    Dispense:  90 tablet    Refill:  1    Follow-up: Return in about 6 months (around 03/21/2024) for Chronic medical conditions.       Hoy Register, MD, FAAFP. [facility] [place], [place] [phone]   09/19/2023, 10:49 AM

## 2023-09-19 NOTE — Patient Instructions (Signed)
 Calambres y espasmos musculares Muscle Cramps and Spasms Los calambres y espasmos musculares se producen cuando un msculo o varios msculos se tensionan, y usted no lo puede Chief Operating Officer (Higher education careers adviser involuntaria). Son un problema frecuente que puede aparecer en cualquier msculo. El lugar ms frecuente es en los msculos de la pantorrilla. Los calambres y espasmos musculares tienen algunas diferencias: Los calambres musculares son dolorosos. Pueden aparecer y Geneticist, molecular, y pueden durar unos segundos o hasta 15 minutos. Los calambres musculares a menudo son ms fuertes y duran ms que los [employer]. Los espasmos musculares pueden o no ser dolorosos. Pueden durar solo unos segundos o mucho ms [employer]. Ciertas afecciones, como la diabetes o la enfermedad de Parkinson, pueden hacer que sea ms propenso a tener calambres o espasmos. Pero en la [employer] casos, los calambres y espasmos no son consecuencia de otras afecciones. Las causas ms frecuentes incluyen las siguientes: Public relations account executive. Esto es cuando se hace ms trabajo fsico o actividad fsica de lo que el cuerpo soporta. Uso excesivo por hacer los mismos movimientos demasiadas veces. Permanecer en una posicin durante [employer]. Preparacin, estado o tcnica inadecuados al practicar un deporte o hacer una [place]. No tener la cantidad suficiente de agua u otros lquidos en el organismo (deshidratacin). Algunas otras causas son las siguientes: Lesiones. Efectos secundarios de algunos medicamentos. Muy pocas sales y minerales en el cuerpo (electrolitos), como potasio y calcio. Esto puede ocurrir si usted toma diurticos o si est embarazada. En muchos casos, se desconoce la causa de los calambres o espasmos musculares. Siga estas instrucciones en su casa: Comida y bebida Beba suficiente lquido para mantener el pis (orina) de color amarillo plido. Esto puede ayudar a prevenir calambres o espasmos. Consuma una  dieta saludable que incluya muchos nutrientes para ayudar al funcionamiento de los msculos. Una dieta saludable incluye frutas y verduras, protenas magras, cereales integrales y productos lcteos descremados o semidescremados. Control del dolor y la rigidez     Intente Engineer, maintenance (IT), Furniture conservator/restorer y International aid/development worker el msculo afectado. Hgalo durante algunos minutos cada vez. Si se lo indican, aplique hielo en los msculos. Esto puede ayudar si despus de un calambre o espasmo tiene dolor o sensibilidad. Ponga el hielo en una bolsa plstica. Coloque una toalla entre la piel y Copy. Aplique el hielo durante 20 minutos, 2 a 3 veces por da. Si se lo indican, aplique calor en los msculos tensos o tirantes, con la frecuencia que le haya indicado el mdico. Use la fuente de calor que el mdico le recomiende, como una compresa de calor hmedo o una almohadilla trmica. Coloque una toalla entre la piel y la fuente de Airline pilot. Aplique calor durante 20 a 30 minutos. Si la piel se le pone de color rojo brillante, retire el hielo o Company secretary de inmediato para evitar daos en la piel. El [place] de dao es mayor si no puede sentir dolor, Airline pilot o fro. Tome duchas o baos con agua caliente para ayudar a [employer]. Instrucciones generales Si tiene calambres con frecuencia, evite el ejercicio intenso durante [employer]. Use los medicamentos de venta libre y los recetados solamente como se lo haya indicado el mdico. Controle si hay algn cambio en sus sntomas. Comunquese con un mdico si: Los calambres o espasmos son ms intensos u ocurren con ms frecuencia. Sus calambres o espasmos no mejoran con Museum/gallery conservator. Esta informacin no tiene Theme park manager el consejo del mdico. Asegrese de hacerle al mdico cualquier pregunta que  tenga. Document Revised: 03/24/2022 Document Reviewed: 03/24/2022 Elsevier Patient Education  2024 [employer].

## 2023-09-20 ENCOUNTER — Encounter: Payer: Self-pay | Admitting: Family Medicine

## 2023-09-20 ENCOUNTER — Other Ambulatory Visit: Payer: Self-pay

## 2023-09-20 LAB — LP+NON-HDL CHOLESTEROL
Cholesterol, Total: 159 mg/dL (ref 100–199)
HDL: 38 mg/dL — ABNORMAL LOW (ref >39)
LDL Chol Calc (NIH): 99 mg/dL (ref 0–99)
Total Non-HDL-Chol (LDL+VLDL): 121 mg/dL (ref 0–129)
Triglycerides: 121 mg/dL (ref 0–149)
VLDL Cholesterol Cal: 22 mg/dL (ref 5–40)

## 2023-09-20 LAB — CMP14+EGFR
ALT: 35 IU/L (ref 0–44)
AST: 24 IU/L (ref 0–40)
Albumin: 4.4 g/dL (ref 4.1–5.1)
Alkaline Phosphatase: 79 IU/L (ref 44–121)
BUN/Creatinine Ratio: 14 (ref 9–20)
BUN: 13 mg/dL (ref 6–24)
Bilirubin Total: 0.3 mg/dL (ref 0.0–1.2)
CO2: 22 mmol/L (ref 20–29)
Calcium: 9 mg/dL (ref 8.7–10.2)
Chloride: 106 mmol/L (ref 96–106)
Creatinine, Ser: 0.93 mg/dL (ref 0.76–1.27)
Globulin, Total: 2.8 g/dL (ref 1.5–4.5)
Glucose: 104 mg/dL — ABNORMAL HIGH (ref 70–99)
Potassium: 4.8 mmol/L (ref 3.5–5.2)
Sodium: 142 mmol/L (ref 134–144)
Total Protein: 7.2 g/dL (ref 6.0–8.5)
eGFR: 100 mL/min/{1.73_m2} (ref >59)

## 2023-09-20 LAB — HEMOGLOBIN A1C
Est. average glucose Bld gHb Est-mCnc: 117 mg/dL
Hgb A1c MFr Bld: 5.7 % — ABNORMAL HIGH (ref 4.8–5.6)

## 2023-09-29 ENCOUNTER — Other Ambulatory Visit: Payer: Self-pay

## 2023-10-03 ENCOUNTER — Ambulatory Visit: Payer: Self-pay | Admitting: Family Medicine

## 2023-10-03 NOTE — Telephone Encounter (Signed)
  Chief Complaint: Side pain - rib area Symptoms: pain Frequency: January Pertinent Negatives: Patient denies any other s/s Disposition: [] ED /[] Urgent Care (no appt availability in office) / [x] Appointment(In office/virtual)/ []  [place] [facility]/ [] Home Care/ [] Refused Recommended Disposition /[] [place] [facility]/ []  Follow-up with PCP Additional Notes: Call from pt's wife. She states that side pain continues for pt since January. Pt is taking IBU and Tylenol for pain. Pt has been seen for this a few times without resolution. No appts available. S/W Zora at the clinic. She scheduled appt for 10/12/2023 2:30. I placed pt on wait list as well. Pt will go to UC or ED if pain worsens or other troubling s/s develop.    Copied from CRM [id number]. Topic: Clinical - Red Word Triage >> Oct 03, 2023  8:45 AM Franchot Heidelberg wrote: Red Word that prompted transfer to Nurse Triage: Lambert Mody severe pain in side near ribs. Worsening for days. Reason for Disposition  [1] MODERATE pain (e.g., interferes with normal activities) AND [2] present > 3 days  Answer Assessment - Initial Assessment Questions 1. ONSET: "When did the muscle aches or body pains start?"      January 2. LOCATION: "What part of your body is hurting?" (e.g., entire body, arms, legs)      Side of chest 3. SEVERITY: "How bad is the pain?" (Scale 1-10; or mild, moderate, severe)   - MILD (1-3): doesn't interfere with normal activities    - MODERATE (4-7): interferes with normal activities or awakens from sleep    - SEVERE (8-10):  excruciating pain, unable to do any normal activities      Unsure 4. CAUSE: "What do you think is causing the pains?"     unsure 5. FEVER: "Have you been having fever?"     no 6. OTHER SYMPTOMS: "Do you have any other symptoms?" (e.g., chest pain, weakness, rash, cold or flu symptoms, weight loss)     no  Protocols used: Muscle Aches and Body Pain-A-AH

## 2023-10-03 NOTE — Telephone Encounter (Signed)
 Noted.

## 2023-10-10 ENCOUNTER — Ambulatory Visit: Payer: Self-pay | Attending: Family Medicine | Admitting: Family Medicine

## 2023-10-10 ENCOUNTER — Encounter: Payer: Self-pay | Admitting: Family Medicine

## 2023-10-10 ENCOUNTER — Other Ambulatory Visit: Payer: Self-pay

## 2023-10-10 ENCOUNTER — Ambulatory Visit (HOSPITAL_COMMUNITY)
Admission: RE | Admit: 2023-10-10 | Discharge: 2023-10-10 | Disposition: A | Discharge status: Home / Self Care | Payer: Self-pay | Source: Ambulatory Visit | Attending: Family Medicine | Admitting: Family Medicine

## 2023-10-10 VITALS — BP 150/77 | HR 61 | Ht 72.0 in | Wt 235.0 lb

## 2023-10-10 DIAGNOSIS — M546 Pain in thoracic spine: Secondary | ICD-10-CM | POA: Insufficient documentation

## 2023-10-10 DIAGNOSIS — G8929 Other chronic pain: Secondary | ICD-10-CM | POA: Insufficient documentation

## 2023-10-10 MED ORDER — DICLOFENAC SODIUM 1 % EX GEL
4.0000 g | Freq: Four times a day (QID) | CUTANEOUS | 1 refills | Status: AC
  Filled 2023-10-10: qty 100, 6d supply, fill #0

## 2023-10-10 MED ORDER — TIZANIDINE HCL 4 MG PO TABS
4.0000 mg | ORAL_TABLET | Freq: Three times a day (TID) | ORAL | 1 refills | Status: DC | PRN
  Filled 2023-10-10: qty 60, 20d supply, fill #0

## 2023-10-10 NOTE — Progress Notes (Signed)
 Subjective:  Patient ID: Seth Harris, male    DOB: 1975-12-24  Age: 48 y.o. MRN: [id number]  CC: Abdominal Pain (Pain in right side of abdomen that travels to back.)     Discussed the use of AI scribe software for clinical note transcription with the patient, who gave verbal consent to proceed.  History of Present Illness The patient, with a history of hypertension and prediabetes, presents a two and a half month history of right-sided abdominal pain, presents for evaluation. The pain, described as moderate in intensity, is located more towards the back and is exacerbated by physical activities such as running or walking. The patient has been managing the pain with over-the-counter medications like ibuprofen and Tylenol. The patient denies any exacerbation of pain with the consumption of fatty or fried foods. He also denies any associated symptoms such as nausea, vomiting, diarrhea, or constipation. Interestingly, the patient reports a sensation of a lump in the area when the pain is present.    Past Medical History:  Diagnosis Date   Allergy    Environmental allergies    Hypertension     Past Surgical History:  Procedure Laterality Date   HERNIA REPAIR      Family History  Problem Relation Age of Onset   Diabetes Mother    Diabetes Brother     Social History   Socioeconomic History   Marital status: Married    Spouse name: Not on file   Number of children: Not on file   Years of education: Not on file   Highest education level: Not on file  Occupational History   Not on file  Tobacco Use   Smoking status: Former    Current packs/day: 0.00    Types: Cigarettes    Quit date: 11/09/2001    Years since quitting: 21.9   Smokeless tobacco: Never  Vaping Use   Vaping status: Never Used  Substance and Sexual Activity   Alcohol use: No   Drug use: No   Sexual activity: Yes    Birth control/protection: None  Other Topics Concern   Not on file   Social History Narrative   Not on file   Social Drivers of Health   Financial Resource Strain: Not on file  Food Insecurity: Not on file  Transportation Needs: Not on file  Physical Activity: Not on file  Stress: Not on file  Social Connections: Not on file    No Known Allergies  Outpatient Medications Prior to Visit  Medication Sig Dispense Refill   amLODipine (NORVASC) 2.5 MG tablet Take 1 tablet (2.5 mg total) by mouth daily. 90 tablet 1   sodium fluoride (PREVIDENT 5000 PLUS) 1.1 % CREA dental cream USE AS DIRECTED:Brush in the morning and at bedtime. 51 g 5   cyclobenzaprine (FLEXERIL) 10 MG tablet Take 1 tablet (10 mg total) by mouth 2 (two) times daily as needed for muscle spasms. (Patient not taking: Reported on 10/10/2023) 20 tablet 0   No facility-administered medications prior to visit.     ROS Review of Systems  Constitutional:  Negative for activity change and appetite change.  HENT:  Negative for sinus pressure and sore throat.   Respiratory:  Negative for chest tightness, shortness of breath and wheezing.   Cardiovascular:  Negative for chest pain and palpitations.  Gastrointestinal:  Negative for abdominal distention, abdominal pain and constipation.  Genitourinary: Negative.   Musculoskeletal:        See HPI  Psychiatric/Behavioral:  Negative  for behavioral problems and dysphoric mood.     Objective:  BP (!) 150/77   Pulse 61   Ht 6' (1.829 m)   Wt 235 lb (106.6 kg)   SpO2 100%   BMI 31.87 kg/m      10/10/2023    9:06 AM 09/19/2023   10:29 AM 09/10/2023   10:21 AM  BP/Weight  Systolic BP 150 130 151  Diastolic BP 77 86 85  Wt. (Lbs) 235 239.2   BMI 31.87 kg/m2 32.44 kg/m2       Physical Exam Constitutional:      Appearance: He is well-developed.  Cardiovascular:     Rate and Rhythm: Normal rate.     Heart sounds: Normal heart sounds. No murmur heard. Pulmonary:     Effort: Pulmonary effort is normal.     Breath sounds: Normal breath  sounds. No wheezing or rales.  Chest:     Chest wall: No tenderness.  Abdominal:     General: Bowel sounds are normal. There is no distension.     Palpations: Abdomen is soft. There is no mass.     Tenderness: There is no abdominal tenderness.  Musculoskeletal:     Right lower leg: No edema.     Left lower leg: No edema.     Comments: Slight right-sided thoracic back pain.  No rash present  Neurological:     Mental Status: He is alert and oriented to person, place, and time.  Psychiatric:        Mood and Affect: Mood normal.        Latest Ref Rng & Units 09/19/2023   10:59 AM 12/02/2022   10:18 AM 06/01/2022    9:03 AM  CMP  Glucose 70 - 99 mg/dL 161  096  045   BUN 6 - 24 mg/dL 13  12  14    Creatinine 0.76 - 1.27 mg/dL 4.09  8.11  9.14   Sodium 134 - 144 mmol/L 142  139  142   Potassium 3.5 - 5.2 mmol/L 4.8  4.3  4.4   Chloride 96 - 106 mmol/L 106  104  104   CO2 20 - 29 mmol/L 22  22  22    Calcium 8.7 - 10.2 mg/dL 9.0  9.1  9.3   Total Protein 6.0 - 8.5 g/dL 7.2  7.0  6.9   Total Bilirubin 0.0 - 1.2 mg/dL 0.3  0.4  0.3   Alkaline Phos 44 - 121 IU/L 79  76  73   AST 0 - 40 IU/L 24  25  23    ALT 0 - 44 IU/L 35  29  33     Lipid Panel     Component Value Date/Time   CHOL 159 09/19/2023 1059   TRIG 121 09/19/2023 1059   HDL 38 (L) 09/19/2023 1059   CHOLHDL 4.9 04/23/2020 0838   CHOLHDL 3.4 08/26/2016 0910   VLDL 27 08/31/2015 0955   LDLCALC 99 09/19/2023 1059    CBC    Component Value Date/Time   WBC 5.2 12/02/2022 1018   WBC 4.6 08/26/2016 0910   RBC 5.56 12/02/2022 1018   RBC 5.55 08/26/2016 0910   HGB 16.2 12/02/2022 1018   HCT 48.1 12/02/2022 1018   PLT 159 12/02/2022 1018   MCV 87 12/02/2022 1018   MCH 29.1 12/02/2022 1018   MCH 30.1 08/26/2016 0910   MCHC 33.7 12/02/2022 1018   MCHC 34.1 08/26/2016 0910   RDW 12.4 12/02/2022 1018  LYMPHSABS 2.1 12/02/2022 1018   MONOABS 276 08/26/2016 0910   EOSABS 0.1 12/02/2022 1018   BASOSABS 0.0 12/02/2022  1018    Lab Results  Component Value Date   HGBA1C 5.7 (H) 09/19/2023       Assessment & Plan Chronic right-sided thoracic back pain Chronic pain for 2.5 months, likely musculoskeletal. Differential includes muscle spasm. Lump sensation may indicate spasm but not palpable on exam.  - Order chest x-ray focusing on right ribcage. - Prescribe tizanidine for muscle relaxation, advise home use due to drowsiness. - Recommend heating pad and massage for pain relief. - Prescribe Voltaren gel for topical use during massage. - Consider physical therapy referral for exercises and massage. -No right upper quadrant tenderness hence low suspicion for gallbladder etiology.        Meds ordered this encounter  Medications   diclofenac Sodium (VOLTAREN) 1 % GEL    Sig: Apply 4 g topically 4 (four) times daily.    Dispense:  100 g    Refill:  1   tiZANidine (ZANAFLEX) 4 MG tablet    Sig: Take 1 tablet (4 mg total) by mouth every 8 (eight) hours as needed.    Dispense:  60 tablet    Refill:  1    Follow-up: Return for previously scheduled appointment.       Hoy Register, MD, FAAFP. [facility] [place], [place] [phone]   10/10/2023, 9:42 AM

## 2023-10-10 NOTE — Patient Instructions (Signed)
 Muscle Cramps and Spasms Muscle cramps and spasms occur when a muscle or muscles tighten and you have no control over this tightening (involuntary muscle contraction). They are a common problem that can happen in any muscle. The most common place is in the calf muscles of the leg. There are a few ways that muscle cramps and spasms differ: Muscle cramps are painful. They come and go and may last for a few seconds or up to 15 minutes. Muscle cramps are often more forceful and last longer than muscle spasms. Muscle spasms may or may not be painful. They may last just a few seconds or last much longer. Certain conditions, such as diabetes or Parkinson's disease, can make you more likely to have cramps or spasms. But in most cases, cramps and spasms are not caused by other conditions. Common causes include: Overexertion. This is when you do more physical work or exercise than your body is ready for. Overuse from doing the same movements too many times. Staying in one position for too long. Improper preparation, form, or technique when playing a sport or doing an activity. Not enough water or other fluids in your body (dehydration). Other causes may include: Injury. Side effects of some medicines. Too few salts and minerals in your body (electrolytes), such as potassium and calcium. This could happen if you are taking water pills (diuretics) or if you are pregnant. In many cases, the cause of muscle cramps or spasms is not known. Follow these instructions at home: Eating and drinking Drink enough fluid to keep your pee (urine) pale yellow. This can help prevent cramps or spasms. Eat a healthy diet that includes a lot of nutrients to help your muscles work. A healthy diet includes fruits and vegetables, lean protein, whole grains, and low-fat or nonfat dairy products. Managing pain and stiffness     Try to massage, stretch, and relax the affected muscle. Do this for a few minutes at a time. If told,  put ice on the muscles. This may help if you are sore or have pain after a cramp or spasm. Put ice in a plastic bag. Place a towel between your skin and the bag. Leave the ice on for 20 minutes, 2-3 times a day. If told, apply heat to tight or tense muscles as often as told by your health care provider. Use the heat source that your provider recommends, such as a moist heat pack or a heating pad. Place a towel between your skin and the heat source. Leave the heat on for 20-30 minutes. If your skin turns bright red, remove the ice or heat right away to prevent skin damage. The risk of damage is higher if you cannot feel pain, heat, or cold. Take hot showers or baths to help relax tight muscles. General instructions If you are having cramps often, avoid intense exercise for a few days. Take over-the-counter and prescription medicines only as told by your provider. Watch for any changes in your symptoms. Contact a health care provider if: Your cramps or spasms get more severe or happen more often. Your cramps or spasms do not get better over time. This information is not intended to replace advice given to you by your health care provider. Make sure you discuss any questions you have with your health care provider. Document Revised: 02/23/2022 Document Reviewed: 02/23/2022 Elsevier Patient Education  2024 [employer].

## 2023-10-12 ENCOUNTER — Ambulatory Visit: Payer: Self-pay | Admitting: Physician Assistant

## 2023-10-13 LAB — FECAL OCCULT BLOOD, IMMUNOCHEMICAL: Fecal Occult Bld: NEGATIVE

## 2023-10-14 ENCOUNTER — Encounter: Payer: Self-pay | Admitting: Family Medicine

## 2023-10-20 ENCOUNTER — Telehealth: Payer: Self-pay

## 2023-10-20 NOTE — Telephone Encounter (Signed)
 Call placed to reading room and they will have results placed in Epic.    Copied from [id number]. Topic: General - Other >> Oct 20, 2023 10:57 AM Seth Harris wrote: Reason for CRM: Patient called in to ask about xray results form 10/12/2023

## 2023-10-21 ENCOUNTER — Encounter: Payer: Self-pay | Admitting: Family Medicine

## 2023-10-28 ENCOUNTER — Other Ambulatory Visit: Payer: Self-pay

## 2023-10-28 ENCOUNTER — Other Ambulatory Visit (HOSPITAL_COMMUNITY): Payer: Self-pay

## 2023-10-28 MED ORDER — CHLORHEXIDINE GLUCONATE 0.12 % MT SOLN
15.0000 mL | Freq: Two times a day (BID) | OROMUCOSAL | 0 refills | Status: AC
Start: 2023-10-28 — End: ?
  Filled 2023-10-28: qty 946, 30d supply, fill #0

## 2023-11-29 ENCOUNTER — Ambulatory Visit: Payer: Self-pay | Admitting: Internal Medicine

## 2024-01-01 NOTE — Progress Notes (Unsigned)
   Acute Office Visit  Subjective:     Patient ID: Seth Harris, male    DOB: 1975-07-30, 48 y.o.   MRN: [id number]  No chief complaint on file.   HPI Patient is in today for back pain  pcp [place]lin Seen 09/2023 for same  Chronic pain for 2.5 months, likely musculoskeletal. Differential includes muscle spasm. Lump sensation may indicate spasm but not palpable on exam.  - Order chest x-ray focusing on right ribcage. - Prescribe tizanidine  for muscle relaxation, advise home use due to drowsiness. - Recommend heating pad and massage for pain relief. - Prescribe Voltaren  gel for topical use during massage. - Consider physical therapy referral for exercises and massage. -No right upper quadrant tenderness hence low suspicion for gallbladder etiology. ROS      Objective:    There were no vitals taken for this visit. {Vitals History (Optional):23777}  Physical Exam  No results found for any visits on 01/05/24.      Assessment & Plan:   Problem List Items Addressed This Visit   None   No orders of the defined types were placed in this encounter.   No follow-ups on file.  Arlene Lacy, MD

## 2024-01-04 ENCOUNTER — Other Ambulatory Visit: Payer: Self-pay

## 2024-01-05 ENCOUNTER — Encounter: Payer: Self-pay | Admitting: Critical Care Medicine

## 2024-01-05 ENCOUNTER — Ambulatory Visit: Payer: Self-pay | Attending: Critical Care Medicine | Admitting: Critical Care Medicine

## 2024-01-05 ENCOUNTER — Other Ambulatory Visit: Payer: Self-pay

## 2024-01-05 VITALS — BP 127/77 | HR 74 | Resp 19 | Ht 72.0 in | Wt 231.4 lb

## 2024-01-05 DIAGNOSIS — S39012D Strain of muscle, fascia and tendon of lower back, subsequent encounter: Secondary | ICD-10-CM

## 2024-01-05 DIAGNOSIS — I10 Essential (primary) hypertension: Secondary | ICD-10-CM | POA: Insufficient documentation

## 2024-01-05 MED ORDER — TIZANIDINE HCL 4 MG PO TABS
4.0000 mg | ORAL_TABLET | Freq: Three times a day (TID) | ORAL | 1 refills | Status: AC | PRN
  Filled 2024-01-05: qty 60, 20d supply, fill #0

## 2024-01-05 MED ORDER — PREDNISONE 10 MG PO TABS
40.0000 mg | ORAL_TABLET | Freq: Every day | ORAL | 0 refills | Status: AC
  Filled 2024-01-05: qty 20, 5d supply, fill #0

## 2024-01-05 MED ORDER — AMLODIPINE BESYLATE 2.5 MG PO TABS
2.5000 mg | ORAL_TABLET | Freq: Every day | ORAL | 1 refills | Status: AC
  Filled 2024-04-03: qty 90, 90d supply, fill #0
  Filled 2024-08-21 (×2): qty 90, 90d supply, fill #1

## 2024-01-05 MED ORDER — DICLOFENAC SODIUM 75 MG PO TBEC
75.0000 mg | DELAYED_RELEASE_TABLET | Freq: Two times a day (BID) | ORAL | 0 refills | Status: AC | PRN
  Filled 2024-01-05: qty 60, 30d supply, fill #0

## 2024-01-05 NOTE — Patient Instructions (Addendum)
 Take prednisone  as prescribed for inflammation Take diclofenac  twice daily as needed for pain Take the tizanidine  as prescribed for muscle spasm Do not lift more than 50 pounds work note was given Follow directions on the attachment for back sprain rehab Return for follow-up visit on July 10 with Dr. Brent Cambric  tome prednisona segn lo prescrito para la inflamacin. 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 Tome tizanidina segn lo prescrito para el espasmo muscular. No levante ms de 23 kg (se entreg una nota de [place]). Siga las instrucciones del documento adjunto para la rehabilitacin del esguince de espalda. Regrese para una visita de seguimiento el 10 de julio con el Dr. Brent Cambric.

## 2024-01-05 NOTE — Assessment & Plan Note (Signed)
 Blood pressure normal has refills on the amlodipine 

## 2024-01-05 NOTE — Assessment & Plan Note (Signed)
 Persistent lower muscular back strain further imaging not indicated suspect nerve root inflammation out of the sacral areas bilaterally and for this will give pulse dose of prednisone  and oral diclofenac   Patient given back exercises  If unimproved the patient will need to go to physical therapy and orthopedics  Will return this patient to July 10 at my next clinic visit

## 2024-02-14 ENCOUNTER — Telehealth: Payer: Self-pay | Admitting: Family Medicine

## 2024-02-14 NOTE — Telephone Encounter (Signed)
 Called patient to confirm upcoming appointment 02/15/2024 at 11:10 am. Patient appointment has been successfully confirmed

## 2024-02-15 ENCOUNTER — Encounter: Payer: Self-pay | Admitting: Family Medicine

## 2024-02-15 ENCOUNTER — Ambulatory Visit: Payer: Self-pay | Attending: Family Medicine | Admitting: Family Medicine

## 2024-02-15 VITALS — BP 124/79 | HR 73 | Ht 72.0 in | Wt 234.0 lb

## 2024-02-15 DIAGNOSIS — G8929 Other chronic pain: Secondary | ICD-10-CM

## 2024-02-15 DIAGNOSIS — R1011 Right upper quadrant pain: Secondary | ICD-10-CM

## 2024-02-15 DIAGNOSIS — I10 Essential (primary) hypertension: Secondary | ICD-10-CM

## 2024-02-15 DIAGNOSIS — M545 Low back pain, unspecified: Secondary | ICD-10-CM

## 2024-02-15 NOTE — Progress Notes (Signed)
 Subjective:  Patient ID: Seth Harris, male    DOB: 05-21-1976  Age: 48 y.o. MRN: [id number]  CC: Back Pain     Discussed the use of AI scribe software for clinical note transcription with the patient, who gave verbal consent to proceed.  History of Present Illness Seth Harris is a 48 year old male with a history of hypertension and prediabetes who presents with persistent back pain and right upper quadrant pain.  He experiences persistent low back pain rated at 2 to 3 out of 10, primarily on the right side without radiation. Pain and stiffness worsen with bending over for work tasks, requiring slow straightening to alleviate discomfort. Diclofenac  and tizanidine  provide some relief.  Symptoms have been present since 09/2023.   Intermittent right upper quadrant pain occurs below the rib, associated with work activities, without nausea or vomiting. He is concerned about a potential internal issue and seeks further imaging. X-ray of his ribs was unremarkable in 09/2023  He works in [employer], involving physical labor that may exacerbate both of his his symptoms.    Past Medical History:  Diagnosis Date   Allergy    Environmental allergies    Hypertension     Past Surgical History:  Procedure Laterality Date   HERNIA REPAIR      Family History  Problem Relation Age of Onset   Diabetes Mother    Diabetes Brother     Social History   Socioeconomic History   Marital status: Married    Spouse name: Not on file   Number of children: Not on file   Years of education: Not on file   Highest education level: Not on file  Occupational History   Not on file  Tobacco Use   Smoking status: Former    Current packs/day: 0.00    Types: Cigarettes    Quit date: 11/09/2001    Years since quitting: 22.2   Smokeless tobacco: Never  Vaping Use   Vaping status: Never Used  Substance and Sexual Activity   Alcohol use: No   Drug use: No    Sexual activity: Yes    Birth control/protection: None  Other Topics Concern   Not on file  Social History Narrative   Not on file   Social Drivers of Health   Financial Resource Strain: Patient Declined (01/05/2024)   Overall Financial Resource Strain (CARDIA)    Difficulty of Paying Living Expenses: Patient declined  Food Insecurity: No Food Insecurity (01/05/2024)   Hunger Vital Sign    Worried About Running Out of Food in the Last Year: Never true    Ran Out of Food in the Last Year: Never true  Transportation Needs: No Transportation Needs (01/05/2024)   PRAPARE - Administrator, Civil Service (Medical): No    Lack of Transportation (Non-Medical): No  Physical Activity: Patient Declined (01/05/2024)   Exercise Vital Sign    Days of Exercise per Week: Patient declined    Minutes of Exercise per Session: Patient declined  Stress: Patient Declined (01/05/2024)   [employer] of Occupational Health - Occupational Stress Questionnaire    Feeling of Stress: Patient declined  Social Connections: Unknown (01/05/2024)   Social Connection and Isolation Panel    Frequency of Communication with Friends and Family: Once a week    Frequency of Social Gatherings with Friends and Family: Once a week    Attends Religious Services: 1 to 4 times per year  Active Member of Clubs or Organizations: Patient declined    Attends Banker Meetings: Patient declined    Marital Status: Patient declined    No Known Allergies  Outpatient Medications Prior to Visit  Medication Sig Dispense Refill   amLODipine  (NORVASC ) 2.5 MG tablet Take 1 tablet (2.5 mg total) by mouth daily. 90 tablet 1   chlorhexidine  (PERIDEX ) 0.12 % solution Rinse with 15 ml two times per day for 30 seconds and then spit. Avoid rinsing or eating for 30 minutes following treatment. Rinse after breakfast and at bedtime. (Patient not taking: Reported on 01/05/2024) 946 mL 0   diclofenac  (VOLTAREN ) 75 MG EC  tablet Take 1 tablet (75 mg total) by mouth 2 (two) times daily as needed. 60 tablet 0   diclofenac  Sodium (VOLTAREN ) 1 % GEL Apply 4 g topically 4 (four) times daily. 100 g 1   sodium fluoride  (PREVIDENT 5000 PLUS) 1.1 % CREA dental cream USE AS DIRECTED:Brush in the morning and at bedtime. (Patient not taking: Reported on 01/05/2024) 51 g 5   tiZANidine  (ZANAFLEX ) 4 MG tablet Take 1 tablet (4 mg total) by mouth every 8 (eight) hours as needed. 60 tablet 1   No facility-administered medications prior to visit.     ROS Review of Systems  Constitutional:  Negative for activity change and appetite change.  HENT:  Negative for sinus pressure and sore throat.   Respiratory:  Negative for chest tightness, shortness of breath and wheezing.   Cardiovascular:  Negative for chest pain and palpitations.  Gastrointestinal:  Positive for abdominal pain. Negative for abdominal distention and constipation.  Genitourinary: Negative.   Musculoskeletal:  Positive for back pain.  Psychiatric/Behavioral:  Negative for behavioral problems and dysphoric mood.     Objective:  BP 124/79   Pulse 73   Ht 6' (1.829 m)   Wt 234 lb (106.1 kg)   SpO2 98%   BMI 31.74 kg/m      02/15/2024   12:06 PM 02/15/2024   11:24 AM 01/05/2024    8:29 AM  BP/Weight  Systolic BP 124 141 127  Diastolic BP 79 89 77  Wt. (Lbs)  234 231.4  BMI  31.74 kg/m2 31.38 kg/m2      Physical Exam Constitutional:      Appearance: He is well-developed.  Cardiovascular:     Rate and Rhythm: Normal rate.     Heart sounds: Normal heart sounds. No murmur heard. Pulmonary:     Effort: Pulmonary effort is normal.     Breath sounds: Normal breath sounds. No wheezing or rales.  Chest:     Chest wall: No tenderness.  Abdominal:     General: Bowel sounds are normal. There is no distension.     Palpations: Abdomen is soft. There is no mass.     Tenderness: There is abdominal tenderness (RUQ).  Musculoskeletal:        General:  Normal range of motion.     Right lower leg: No edema.     Left lower leg: No edema.  Neurological:     Mental Status: He is alert and oriented to person, place, and time.  Psychiatric:        Mood and Affect: Mood normal.        Latest Ref Rng & Units 09/19/2023   10:59 AM 12/02/2022   10:18 AM 06/01/2022    9:03 AM  CMP  Glucose 70 - 99 mg/dL 895  890  886   BUN 6 -  24 mg/dL 13  12  14    Creatinine 0.76 - 1.27 mg/dL 9.06  9.10  8.98   Sodium 134 - 144 mmol/L 142  139  142   Potassium 3.5 - 5.2 mmol/L 4.8  4.3  4.4   Chloride 96 - 106 mmol/L 106  104  104   CO2 20 - 29 mmol/L 22  22  22    Calcium 8.7 - 10.2 mg/dL 9.0  9.1  9.3   Total Protein 6.0 - 8.5 g/dL 7.2  7.0  6.9   Total Bilirubin 0.0 - 1.2 mg/dL 0.3  0.4  0.3   Alkaline Phos 44 - 121 IU/L 79  76  73   AST 0 - 40 IU/L 24  25  23    ALT 0 - 44 IU/L 35  29  33     Lipid Panel     Component Value Date/Time   CHOL 159 09/19/2023 1059   TRIG 121 09/19/2023 1059   HDL 38 (L) 09/19/2023 1059   CHOLHDL 4.9 04/23/2020 0838   CHOLHDL 3.4 08/26/2016 0910   VLDL 27 08/31/2015 0955   LDLCALC 99 09/19/2023 1059    CBC    Component Value Date/Time   WBC 5.2 12/02/2022 1018   WBC 4.6 08/26/2016 0910   RBC 5.56 12/02/2022 1018   RBC 5.55 08/26/2016 0910   HGB 16.2 12/02/2022 1018   HCT 48.1 12/02/2022 1018   PLT 159 12/02/2022 1018   MCV 87 12/02/2022 1018   MCH 29.1 12/02/2022 1018   MCH 30.1 08/26/2016 0910   MCHC 33.7 12/02/2022 1018   MCHC 34.1 08/26/2016 0910   RDW 12.4 12/02/2022 1018   LYMPHSABS 2.1 12/02/2022 1018   MONOABS 276 08/26/2016 0910   EOSABS 0.1 12/02/2022 1018   BASOSABS 0.0 12/02/2022 1018    Lab Results  Component Value Date   HGBA1C 5.7 (H) 09/19/2023       Assessment & Plan Chronic right-sided low back pain Pain localized to right lumbar region, relieved by standing, worsened by his job managed with current medications. - Continue diclofenac  and tizanidine . - Order x-ray of the  lower back. - Refer to physical therapy for exercises, massage, heat, and electrical stimulation.  Right upper quadrant pain Intermittent pain in right upper quadrant, possible muscle spasm or hepatobiliary issue. - Order ultrasound of the liver and gallbladder.  Hypertension Slightly elevated blood pressure, normalized on repeat, managed with amlodipine . - Continue amlodipine .    No orders of the defined types were placed in this encounter.   Follow-up: Return in about 6 months (around 08/17/2024) for Chronic medical conditions.       Corrina Sabin, MD, FAAFP. [facility] and [facility] [place], [place] [phone]   02/15/2024, 12:31 PM

## 2024-02-15 NOTE — Patient Instructions (Signed)
 Dolor de espalda crnico Chronic Back Pain El dolor de espalda crnico es aquel que dura ms de 3 meses. Es posible que se desconozca la causa de esta afeccin. Algunas causas frecuentes son las siguientes: Desgaste normal (enfermedad degenerativa) de los [place], los discos o los tejidos que conectan los [employer] s (ligamentos) de la espalda. Inflamacin y rigidez en la espalda (artritis). Si tiene dolor de espalda crnico, puede haber momentos en los que el dolor sea ms intenso (exacerbaciones). Usted tambin puede aprender a Human resources officer con el cuidado Facilities manager. Siga estas instrucciones en su casa: Controle si hay algn cambio en sus sntomas. Tome estas medidas para Acupuncturist dolor: Control del dolor y la rigidez     Si se lo indican, aplique hielo sobre la zona dolorida. Pueden indicarle que se aplique hielo durante las primeras 24 a 48 horas luego del comienzo de Investment banker, corporate. Ponga el hielo en una bolsa plstica. Coloque una toalla entre la piel y Copy. Aplique el hielo durante 20 minutos, 2 o 3 veces por da. Si se lo indican, aplique calor en la zona afectada con la frecuencia que le haya indicado el mdico. Use la fuente de calor que el mdico le recomiende, como una compresa de calor hmedo o una almohadilla trmica. Coloque una toalla entre la piel y la fuente de Airline pilot. Aplique calor durante 20 a 30 minutos. Si la piel se le pone de color rojo brillante, retire el hielo o Company secretary de inmediato para evitar daos en la piel. El [place] de dao es mayor si no puede sentir dolor, Airline pilot o fro. Intente tomar un bao de inmersin con agua caliente. Actividad        Evite inclinarse y Education officer, environmental otras actividades que Forensic scientist. Mantenga una buena postura cuando est de pie o sentado. Cuando est de pie, mantenga la parte alta de la espalda y el cuello rectos, con los hombros [place]. Evite encorvarse. Cuando est sentado, mantenga la espalda recta.  Relaje los hombros. No curve los hombros ni los [employer]. No permanezca sentado o de pie en el mismo lugar [place]. Durante el da, descanse durante lapsos breves. Esto le Engineer, materials. Descansar recostado o de pie suele ser mejor que hacerlo sentado. Cuando descanse durante perodos ms largos, incorpore alguna [place] o ejercicios de [employer] perodos de descanso. Esto ayudar a Transport planner rigidez y Chief Technology Officer. Haga ejercicio con regularidad. Pregntele al mdico qu actividades son seguras para usted. Es posible que Personnel officer objetos. Pregntele al mdico cunto peso puede levantar sin correr [employer]. Si levanta objetos, use siempre la tcnica correcta. Esto significa que debe hacer lo siguiente: Flexione las rodillas. Mantenga la carga cerca del cuerpo. No gire el cuerpo. Medicamentos Use los medicamentos de venta libre y los recetados solo como se lo haya indicado el mdico. Es posible que deba usar analgsicos para Chief Technology Officer y la inflamacin. Estos pueden tomarse por boca o colocarse sobre la piel. Tambin pueden darle relajantes musculares. Pregntele al mdico si el medicamento que se le recet: Requiere que evite conducir o usar [place]. Puede causarle estreimiento. Es posible que tenga que tomar estas medidas para prevenir o tratar el estreimiento: Product manager suficiente lquido para Radio producer pis (orina) de color amarillo plido. Usar medicamentos recetados o de Sales promotion account executive. Consumir alimentos ricos en fibra, como frijoles, cereales integrales, y frutas y verduras frescas. Limitar el consumo de alimentos ricos en grasa  y azcares procesados, como los alimentos fritos o dulces. Instrucciones generales  Duerma sobre un colchn firme en una posicin cmoda. Intente acostarse de costado, con las rodillas ligeramente flexionadas. Si se recuesta [employer], coloque una almohada debajo de las rodillas. No consuma ningn producto que  contenga nicotina o tabaco. Estos productos incluyen cigarrillos, tabaco para Theatre manager y aparatos de vapeo, como los Administrator, Civil Service. Si necesita ayuda para dejar de fumar, consulte al mdico. Comunquese con un mdico si: Tiene un dolor que no mejora con descanso ni medicamentos. Siente un dolor nuevo. Tiene fiebre. Pierde peso con rapidez. Tiene dificultad para [employer] cotidianas. Siente debilidad o adormecimiento en una o ambas piernas, o en uno o ambos pies. Solicite ayuda de inmediato si: No puede controlar la miccin o la defecacin. Siente dolor intenso en la espalda y: Nuseas o vmitos. Dolor en el pecho o el abdomen. Falta de aire. Se desmaya. Estos sntomas pueden Customer service manager. Solicite ayuda de inmediato. Llame al 911. No espere a ver si los sntomas desaparecen. No conduzca por sus propios medios [employer]. Esta informacin no tiene Theme park manager el consejo del mdico. Asegrese de hacerle al mdico cualquier pregunta que tenga. Document Revised: 03/24/2022 Document Reviewed: 03/24/2022 Elsevier Patient Education  2024 [employer].

## 2024-02-20 ENCOUNTER — Ambulatory Visit (HOSPITAL_COMMUNITY)
Admission: RE | Admit: 2024-02-20 | Discharge: 2024-02-20 | Disposition: A | Discharge status: Home / Self Care | Payer: Self-pay | Source: Ambulatory Visit | Attending: Family Medicine | Admitting: Family Medicine

## 2024-02-20 DIAGNOSIS — M545 Low back pain, unspecified: Secondary | ICD-10-CM

## 2024-02-20 DIAGNOSIS — G8929 Other chronic pain: Secondary | ICD-10-CM | POA: Insufficient documentation

## 2024-02-20 DIAGNOSIS — R1011 Right upper quadrant pain: Secondary | ICD-10-CM | POA: Insufficient documentation

## 2024-02-21 ENCOUNTER — Ambulatory Visit: Payer: Self-pay | Admitting: Family Medicine

## 2024-02-21 DIAGNOSIS — R1011 Right upper quadrant pain: Secondary | ICD-10-CM

## 2024-03-02 ENCOUNTER — Ambulatory Visit (INDEPENDENT_AMBULATORY_CARE_PROVIDER_SITE_OTHER): Payer: Self-pay | Admitting: Surgery

## 2024-03-02 ENCOUNTER — Encounter: Payer: Self-pay | Admitting: Surgery

## 2024-03-02 VITALS — BP 132/84 | HR 76 | Temp 98.5°F | Ht 72.0 in | Wt 232.6 lb

## 2024-03-02 DIAGNOSIS — M549 Dorsalgia, unspecified: Secondary | ICD-10-CM

## 2024-03-02 DIAGNOSIS — K828 Other specified diseases of gallbladder: Secondary | ICD-10-CM

## 2024-03-02 DIAGNOSIS — R1011 Right upper quadrant pain: Secondary | ICD-10-CM

## 2024-03-02 NOTE — Progress Notes (Signed)
 03/02/2024  Reason for Visit: Gallbladder sludge  Requesting Provider:  Corrina Sabin, MD  History of Present Illness: Gilmer Kaminsky Claris Guymon is a 48 y.o. male presenting for evaluation of cholelithiasis.  The patient reports that back in February of this year he suffered a possible injury in the right side of his abdomen/chest.  He went to urgent care and had an x-ray of his ribs which did not show any fractures.  This was thought to be musculoskeletal in nature.  He was given medications for this and he also used an ointment to help but he reports that for the most part any discomfort in that area went away.  However he still has intermittently some pain that goes towards the right upper quadrant and back area.  He feels it is more related to his activity level.  Denies noticing any specific relationship with food intake.  Given that this pain was not improving, he asked his PCP for further imaging.  He had an ultrasound of the right upper quadrant on 02/20/24 which showed no gallstones but showed gallbladder sludge with also hepatic steatosis.  Patient denies any fevers, chills, nausea, vomiting.  He does report initially the pain was more severe and would stop him from taking a deep breath.  Past Medical History: Past Medical History:  Diagnosis Date   Allergy    Environmental allergies    Hypertension      Past Surgical History: Past Surgical History:  Procedure Laterality Date   HERNIA REPAIR      Home Medications: Prior to Admission medications   Medication Sig Start Date End Date Taking? Authorizing Provider  amLODipine  (NORVASC ) 2.5 MG tablet Take 1 tablet (2.5 mg total) by mouth daily. 01/05/24  Yes Brien Belvie BRAVO, MD  diclofenac  (VOLTAREN ) 75 MG EC tablet Take 1 tablet (75 mg total) by mouth 2 (two) times daily as needed. 01/05/24  Yes Brien Belvie BRAVO, MD  diclofenac  Sodium (VOLTAREN ) 1 % GEL Apply 4 g topically 4 (four) times daily. 10/10/23  Yes Newlin, Enobong,  MD  sodium fluoride  (PREVIDENT 5000 PLUS) 1.1 % CREA dental cream USE AS DIRECTED:Brush in the morning and at bedtime. 10/12/22  Yes   chlorhexidine  (PERIDEX ) 0.12 % solution Rinse with 15 ml two times per day for 30 seconds and then spit. Avoid rinsing or eating for 30 minutes following treatment. Rinse after breakfast and at bedtime. Patient not taking: Reported on 03/02/2024 10/28/23     tiZANidine  (ZANAFLEX ) 4 MG tablet Take 1 tablet (4 mg total) by mouth every 8 (eight) hours as needed. Patient not taking: Reported on 03/02/2024 01/05/24   Brien Belvie BRAVO, MD    Allergies: No Known Allergies  Social History:  reports that he quit smoking about 22 years ago. His smoking use included cigarettes. He has never used smokeless tobacco. He reports that he does not drink alcohol and does not use drugs.   Family History: Family History  Problem Relation Age of Onset   Diabetes Mother    Diabetes Brother     Review of Systems: Review of Systems  Constitutional:  Negative for chills and fever.  Respiratory:  Negative for shortness of breath.   Cardiovascular:  Negative for chest pain.  Gastrointestinal:  Positive for abdominal pain. Negative for nausea and vomiting.  Musculoskeletal:  Positive for back pain.    Physical Exam BP 132/84   Pulse 76   Temp 98.5 F (36.9 C) (Oral)   Ht 6' (1.829 m)  Wt 232 lb 9.6 oz (105.5 kg)   SpO2 98%   BMI 31.55 kg/m  CONSTITUTIONAL: No acute distress HEENT:  Normocephalic, atraumatic, extraocular motion intact. RESPIRATORY:  Normal respiratory effort without pathologic use of accessory muscles. CARDIOVASCULAR: Regular rhythm and rate. GI: The abdomen is soft, nondistended, currently nontender to palpation.  Negative Murphy's sign.  Umbilical incision prior hernia repair is well-healed.  MUSCULOSKELETAL: Patient has point tenderness in the lateral aspect of his right back overlying a rib, likely fifth rib area which radiates somewhat towards the  front.  No peripheral edema or cyanosis. NEUROLOGIC:  Motor and sensation is grossly normal.  Cranial nerves are grossly intact. PSYCH:  Alert and oriented to person, place and time. Affect is normal.  Laboratory Analysis: Labs on 09/19/2023: Sodium 142, potassium 4.8, chloride 106, CO2 22, BUN 13, creatinine 0.93.  Total bilirubin 0.3, AST 24, ALT 35, alkaline phosphatase 79.  Hemoglobin A1c 5.7.  Imaging: Ultrasound RUQ on 02/20/2024: IMPRESSION: Diffusely increased hepatic echogenicity suggestive of hepatic steatosis. Query small volume gallbladder sludge without discrete stones.  Assessment and Plan: This is a 48 y.o. male with right back/upper quadrant pain.  - Discussed with the patient that based on the findings on exam today, I do feel that his discomfort is more related to musculoskeletal nature rather than biliary etiology.  He does have potential biliary sludge in his gallbladder but his discomfort is not associated with any p.o. intake and more with physical activity.  As such, do not think at this point that he warrants any surgical intervention. - As a precaution, we will give him information about low-fat diet.  This will help him also with losing weight which would be beneficial for his hepatic steatosis. - Although the precaution, recommended to the patient to keep a closer eye on the episodes of pain that he has and whether there is any relationship with food.  If that is the case, then he can follow-up with us  to discuss further any surgical intervention.  Or if in the future he does start having some symptoms of biliary colic, he can always call us  we can see him again.  However I do feel at this point, this most likely musculoskeletal in nature. - Follow-up as needed.  I spent 30 minutes dedicated to the care of this patient on the date of this encounter to include pre-visit review of records, face-to-face time with the patient discussing diagnosis and management, and any  post-visit coordination of care.   Aloysius Sheree Plant, MD [facility]

## 2024-03-02 NOTE — Patient Instructions (Signed)
 Gallbladder Eating Plan High blood cholesterol, obesity, a sedentary lifestyle, an unhealthy diet, and diabetes are risk factors for developing gallstones. If you have a gallbladder condition, you may have trouble digesting fats and tolerating high fat intake. Eating a low-fat diet can help reduce your symptoms and may be helpful before and after having surgery to remove your gallbladder (cholecystectomy). Your health care provider may recommend that you work with a dietitian to help you reduce the amount of fat in your diet. What are tips for following this plan? General guidelines Limit your fat intake to less than 30% of your total daily calories. If you eat around 1,800 calories each day, this means eating less than 60 grams (g) of fat per day. Fat is an important part of a healthy diet. Eating a low-fat diet can make it hard to maintain a healthy body weight. Ask your dietitian how much fat, calories, and other nutrients you need each day. Eat small, frequent meals throughout the day instead of three large meals. Drink at least 8-10 cups (1.9-2.4 L) of fluid a day. Drink enough fluid to keep your urine pale yellow. If you drink alcohol: Limit how much you have to: 0-1 drink a day for women who are not pregnant. 0-2 drinks a day for men. Know how much alcohol is in a drink. In the [place] one drink equals one 12 oz bottle of beer (355 mL), one 5 oz glass of wine (148 mL), or one 1 oz glass of hard liquor (44 mL). Reading food labels  Check nutrition facts on food labels for the amount of fat per serving. Choose foods with less than 3 grams of fat per serving. Shopping Choose nonfat and low-fat healthy foods. Look for the words "nonfat," "low-fat," or "fat-free." Avoid buying processed or prepackaged foods. Cooking Cook using low-fat methods, such as baking, broiling, grilling, or boiling. Cook with small amounts of healthy fats, such as olive oil, grapeseed oil, canola oil, avocado oil, or  sunflower oil. What foods are recommended?  All fresh, frozen, or canned fruits and vegetables. Whole grains. Low-fat or nonfat (skim) milk and yogurt. Lean meat, skinless poultry, fish, eggs, and beans. Low-fat protein supplement powders or drinks. Spices and herbs. The items listed above may not be a complete list of foods and beverages you can eat and drink. Contact a dietitian for more information. What foods are not recommended? High-fat foods. These include baked goods, fast food, fatty cuts of meat, ice cream, french toast, sweet rolls, pizza, cheese bread, foods covered with butter, creamy sauces, or cheese. Fried foods. These include french fries, tempura, battered fish, breaded chicken, fried breads, and sweets. Foods that cause bloating and gas. The items listed above may not be a complete list of foods that you should avoid. Contact a dietitian for more information. Summary A low-fat diet can be helpful if you have a gallbladder condition, or before and after gallbladder surgery. Limit your fat intake to less than 30% of your total daily calories. This is about 60 g of fat if you eat 1,800 calories each day. Eat small, frequent meals throughout the day instead of three large meals. This information is not intended to replace advice given to you by your health care provider. Make sure you discuss any questions you have with your health care provider. Document Revised: 06/19/2021 Document Reviewed: 06/19/2021 Elsevier Patient Education  2024 [employer].

## 2024-03-26 ENCOUNTER — Ambulatory Visit: Payer: Self-pay | Admitting: Family Medicine

## 2024-04-03 ENCOUNTER — Other Ambulatory Visit: Payer: Self-pay

## 2024-04-17 ENCOUNTER — Ambulatory Visit: Payer: Self-pay | Attending: Family Medicine | Admitting: Family Medicine

## 2024-04-17 ENCOUNTER — Encounter: Payer: Self-pay | Admitting: Family Medicine

## 2024-04-17 VITALS — BP 129/77 | HR 88 | Ht 72.0 in | Wt 233.4 lb

## 2024-04-17 DIAGNOSIS — Z87891 Personal history of nicotine dependence: Secondary | ICD-10-CM

## 2024-04-17 DIAGNOSIS — R221 Localized swelling, mass and lump, neck: Secondary | ICD-10-CM

## 2024-04-17 DIAGNOSIS — Z79899 Other long term (current) drug therapy: Secondary | ICD-10-CM

## 2024-04-17 DIAGNOSIS — K828 Other specified diseases of gallbladder: Secondary | ICD-10-CM

## 2024-04-17 DIAGNOSIS — K760 Fatty (change of) liver, not elsewhere classified: Secondary | ICD-10-CM

## 2024-04-17 NOTE — Progress Notes (Signed)
 Subjective:  Patient ID: Seth Harris, male    DOB: 1976-01-15  Age: 48 y.o. MRN: [id number]  CC: Thyroid Problem (Discuss liver concern)     Discussed the use of AI scribe software for clinical note transcription with the patient, who gave verbal consent to proceed.  History of Present Illness Seth Harris Veldon Wager is a 48 year old male  with a history of hypertension and prediabetes who presents with concerns about thyroid and liver health.  He was informed of having a fatty liver approximately six weeks ago following studies conducted at his church. He is unsure about available treatments. His last liver enzyme tests were conducted in March. Of note he had complained of persistent right upper quadrant pain and right upper quadrant ultrasound which I had ordered revealed presence of hepatic steatosis as well as gallbladder sludge.  I had referred him to general surgery but no surgical management was recommended as his pain was thought to be more musculoskeletal.  He is concerned about his thyroid, having been told of an 'inflamed' thyroid during the same church studies. He experiences some neck discomfort but lacks specific details about the thyroid issue. His last thyroid test was normal but this was in 2020.    Past Medical History:  Diagnosis Date   Allergy    Environmental allergies    Hypertension     Past Surgical History:  Procedure Laterality Date   HERNIA REPAIR      Family History  Problem Relation Age of Onset   Diabetes Mother    Diabetes Brother     Social History   Socioeconomic History   Marital status: Married    Spouse name: Not on file   Number of children: Not on file   Years of education: Not on file   Highest education level: Not on file  Occupational History   Not on file  Tobacco Use   Smoking status: Former    Current packs/day: 0.00    Types: Cigarettes    Quit date: 11/09/2001    Years since quitting: 22.4    Smokeless tobacco: Never  Vaping Use   Vaping status: Never Used  Substance and Sexual Activity   Alcohol use: No   Drug use: No   Sexual activity: Yes    Birth control/protection: None  Other Topics Concern   Not on file  Social History Narrative   Not on file   Social Drivers of Health   Financial Resource Strain: Patient Declined (01/05/2024)   Overall Financial Resource Strain (CARDIA)    Difficulty of Paying Living Expenses: Patient declined  Food Insecurity: No Food Insecurity (01/05/2024)   Hunger Vital Sign    Worried About Running Out of Food in the Last Year: Never true    Ran Out of Food in the Last Year: Never true  Transportation Needs: No Transportation Needs (01/05/2024)   PRAPARE - Administrator, Civil Service (Medical): No    Lack of Transportation (Non-Medical): No  Physical Activity: Patient Declined (01/05/2024)   Exercise Vital Sign    Days of Exercise per Week: Patient declined    Minutes of Exercise per Session: Patient declined  Stress: Patient Declined (01/05/2024)   [employer] of Occupational Health - Occupational Stress Questionnaire    Feeling of Stress: Patient declined  Social Connections: Unknown (01/05/2024)   Social Connection and Isolation Panel    Frequency of Communication with Friends and Family: Once a week  Frequency of Social Gatherings with Friends and Family: Once a week    Attends Religious Services: 1 to 4 times per year    Active Member of Clubs or Organizations: Patient declined    Attends Banker Meetings: Patient declined    Marital Status: Patient declined    No Known Allergies  Outpatient Medications Prior to Visit  Medication Sig Dispense Refill   amLODipine  (NORVASC ) 2.5 MG tablet Take 1 tablet (2.5 mg total) by mouth daily. 90 tablet 1   diclofenac  (VOLTAREN ) 75 MG EC tablet Take 1 tablet (75 mg total) by mouth 2 (two) times daily as needed. 60 tablet 0   diclofenac  Sodium (VOLTAREN ) 1  % GEL Apply 4 g topically 4 (four) times daily. 100 g 1   sodium fluoride  (PREVIDENT 5000 PLUS) 1.1 % CREA dental cream USE AS DIRECTED:Brush in the morning and at bedtime. 51 g 5   chlorhexidine  (PERIDEX ) 0.12 % solution Rinse with 15 ml two times per day for 30 seconds and then spit. Avoid rinsing or eating for 30 minutes following treatment. Rinse after breakfast and at bedtime. (Patient not taking: Reported on 04/17/2024) 946 mL 0   tiZANidine  (ZANAFLEX ) 4 MG tablet Take 1 tablet (4 mg total) by mouth every 8 (eight) hours as needed. (Patient not taking: Reported on 04/17/2024) 60 tablet 1   No facility-administered medications prior to visit.     ROS Review of Systems  Constitutional:  Negative for activity change and appetite change.  HENT:  Negative for sinus pressure and sore throat.   Respiratory:  Negative for chest tightness, shortness of breath and wheezing.   Cardiovascular:  Negative for chest pain and palpitations.  Gastrointestinal:  Negative for abdominal distention, abdominal pain and constipation.  Genitourinary: Negative.   Musculoskeletal: Negative.   Psychiatric/Behavioral:  Negative for behavioral problems and dysphoric mood.     Objective:  BP 129/77   Pulse 88   Ht 6' (1.829 m)   Wt 233 lb 6.4 oz (105.9 kg)   SpO2 99%   BMI 31.65 kg/m      04/17/2024   10:59 AM 03/02/2024   10:01 AM 02/15/2024   12:06 PM  BP/Weight  Systolic BP 129 132 124  Diastolic BP 77 84 79  Wt. (Lbs) 233.4 232.6   BMI 31.65 kg/m2 31.55 kg/m2       Physical Exam Constitutional:      Appearance: He is well-developed.  Neck:     Comments: No apparent enlargement of the thyroid gland Cardiovascular:     Rate and Rhythm: Normal rate.     Heart sounds: Normal heart sounds. No murmur heard. Pulmonary:     Effort: Pulmonary effort is normal.     Breath sounds: Normal breath sounds. No wheezing or rales.  Chest:     Chest wall: No tenderness.  Abdominal:     General: Bowel  sounds are normal. There is no distension.     Palpations: Abdomen is soft. There is no mass.     Tenderness: There is no abdominal tenderness.  Musculoskeletal:        General: Normal range of motion.     Cervical back: Normal range of motion.     Right lower leg: No edema.     Left lower leg: No edema.  Neurological:     Mental Status: He is alert and oriented to person, place, and time.  Psychiatric:        Mood and Affect: Mood normal.  Latest Ref Rng & Units 09/19/2023   10:59 AM 12/02/2022   10:18 AM 06/01/2022    9:03 AM  CMP  Glucose 70 - 99 mg/dL 895  890  886   BUN 6 - 24 mg/dL 13  12  14    Creatinine 0.76 - 1.27 mg/dL 9.06  9.10  8.98   Sodium 134 - 144 mmol/L 142  139  142   Potassium 3.5 - 5.2 mmol/L 4.8  4.3  4.4   Chloride 96 - 106 mmol/L 106  104  104   CO2 20 - 29 mmol/L 22  22  22    Calcium 8.7 - 10.2 mg/dL 9.0  9.1  9.3   Total Protein 6.0 - 8.5 g/dL 7.2  7.0  6.9   Total Bilirubin 0.0 - 1.2 mg/dL 0.3  0.4  0.3   Alkaline Phos 44 - 121 IU/L 79  76  73   AST 0 - 40 IU/L 24  25  23    ALT 0 - 44 IU/L 35  29  33     Lipid Panel     Component Value Date/Time   CHOL 159 09/19/2023 1059   TRIG 121 09/19/2023 1059   HDL 38 (L) 09/19/2023 1059   CHOLHDL 4.9 04/23/2020 0838   CHOLHDL 3.4 08/26/2016 0910   VLDL 27 08/31/2015 0955   LDLCALC 99 09/19/2023 1059    CBC    Component Value Date/Time   WBC 5.2 12/02/2022 1018   WBC 4.6 08/26/2016 0910   RBC 5.56 12/02/2022 1018   RBC 5.55 08/26/2016 0910   HGB 16.2 12/02/2022 1018   HCT 48.1 12/02/2022 1018   PLT 159 12/02/2022 1018   MCV 87 12/02/2022 1018   MCH 29.1 12/02/2022 1018   MCH 30.1 08/26/2016 0910   MCHC 33.7 12/02/2022 1018   MCHC 34.1 08/26/2016 0910   RDW 12.4 12/02/2022 1018   LYMPHSABS 2.1 12/02/2022 1018   MONOABS 276 08/26/2016 0910   EOSABS 0.1 12/02/2022 1018   BASOSABS 0.0 12/02/2022 1018    Lab Results  Component Value Date   HGBA1C 5.7 (H) 09/19/2023    Lab  Results  Component Value Date   TSH 2.230 06/18/2019       Assessment & Plan Anterior neck swelling Suspected thyroid swelling from health screening report. No prior thyroid issues. Previous thyroid function tests normal. - Order thyroid ultrasound. - Order repeat thyroid blood tests.  Hepatic steatosis Confirmed by ultrasound. Managed with lifestyle changes, weight loss, and cholesterol reduction. - Provide educational materials in Spanish about fatty liver disease. - Advise on weight loss and low cholesterol diet. - Repeat liver enzyme test.   Gallbladder sludge Thought not to be the source of his pain per general surgery as pain is thought to be musculoskeletal   Confirmed by ultrasound. Managed with lifestyle changes, weight loss, and cholesterol reduction. - Provide educational materials in Spanish about fatty liver disease. - Advise on weight loss and low cholesterol diet. - Repeat liver enzyme test.     Healthcare maintenance Declined flu shot  No orders of the defined types were placed in this encounter.   Follow-up: Return for previously scheduled appointment.       Corrina Sabin, MD, FAAFP. [facility] [place], [place] [phone]   04/17/2024, 12:15 PM

## 2024-04-17 NOTE — Patient Instructions (Signed)
 Enfermedad del hgado graso (esteatosis heptica): Qu debe saber Fatty Liver Disease (Steatotic Liver Disease): What to Know  El hgado es un rgano que se encarga de muchas tareas. Produce protenas y [place] a Chartered certified accountant. Tambin elimina las cosas nocivas de la sangre y absorbe las vitaminas de los alimentos. La enfermedad del hgado graso sucede cuando se acumula demasiada grasa en las clulas del hgado. Tambin se la denomina esteatosis heptica. En muchos casos, la enfermedad del hgado graso no provoca sntomas. Pero con el tiempo, puede causar irritacin e hinchazn. Esto puede derivar en otros problemas hepticos como, por ejemplo: La cirrosis, o formacin de cicatrices en el hgado. Cncer de hgado. Insuficiencia heptica. Cules son las causas? Las causas de la enfermedad del hgado graso pueden ser las siguientes: Tener sobrepeso. Tener: Engineer, production. Hipertensin arterial. Sndrome de [place]. No obtener suficientes nutrientes en la dieta. Otras causas son: Determinados medicamentos. Txicos. Algunas infecciones provocadas por un germen llamado virus. Qu incrementa el riesgo? Es ms probable que tenga la enfermedad del hgado graso si: Bebe alcohol. Tiene sobrepeso. Tiene diabetes. Tiene hepatitis. Tiene un nivel alto de triglicridos. Est embarazada. Cules son los signos o sntomas? Es posible que no presente sntomas. Si los tiene, pueden [employer] siguientes: Sensacin de debilidad y [place]. [place]ar de [place]. Ganas de vomitar. Vmitos. Ictericia. Sucede cuando la piel y la parte blanca de los ojos se ponen amarillos. Hinchazn en el vientre o las piernas. Dolor a Merchandiser, retail parte superior derecha del vientre. Cmo se diagnostica? La enfermedad del hgado graso se puede diagnosticar en funcin de sus antecedentes mdicos y un examen. Tambin podra necesitar pruebas. Pueden incluir: Anlisis de sangre. Una  ecografa. Una exploracin por tomografa computarizada (TC). Resonancia magntica (RM). Biopsia. Consiste en extraer Carlynn Herald de tejido del hgado para analizarla. Cmo se trata? Con frecuencia, la enfermedad del hgado graso es causada por otras afecciones. Tal vez deba tomar medicamentos y Radio producer cambios en su estilo de vida. Estos cambios pueden ayudarlo a Warehouse manager las siguientes: Trastorno por consumo de alcohol. Se trata de una afeccin en la que puede resultar difcil dejar de beber. Colesterol alto. Diabetes. Tener sobrepeso. Siga estas indicaciones en su casa: Consuma una dieta saludable. Trabaje con su mdico o con un experto en alimentacin saludable llamado nutricionista. Ellos pueden ayudarlo a Runner, broadcasting/film/video de alimentacin. Ejerctese lo suficiente. Esto puede ayudarlo a Publishing copy de [place]. Tambin puede ayudarlo a Public house manager y la diabetes. Hable con su mdico sobre un plan de ejercicio. Pregunte cules son las mejores actividades para que haga. No beba alcohol. Si tiene problemas para dejar de fumar, pdale ayuda al mdico. Use sus medicamentos nicamente segn las indicaciones. Concurra a todas las visitas de seguimiento. El mdico controlar si su situacin mejora. Comunquese con un mdico si: No puede controlar su nivel de azcar en la sangre. Esto es sumamente importante si tiene diabetes. Tiene fiebre. Tiene el vientre o las piernas hinchados. Siente dolor en el abdomen. Tiene ictericia. Tiene ganas de vomitar. Vomita. Solicite ayuda de inmediato si: Vomita, y el vmito tiene el siguiente aspecto: Sangre de color rojo brillante. Borra de caf. Vomita una sustancia que se parece a la borra de caf. Su materia fecal se ve sanguinolenta o negra. Se siente confundido. Estos sntomas pueden Customer service manager. Llame al 911 de inmediato. No espere a ver si los sntomas desaparecen. No conduzca por sus propios medios [employer]. Yetta [place]re  informacin no tiene Theme park manager el consejo del mdico. Asegrese de hacerle al mdico cualquier pregunta que tenga. Document Revised: 02/15/2023 Document Reviewed: 02/15/2023 Elsevier Patient Education  2024 [employer].

## 2024-04-18 ENCOUNTER — Ambulatory Visit: Payer: Self-pay | Admitting: Family Medicine

## 2024-04-18 DIAGNOSIS — R590 Localized enlarged lymph nodes: Secondary | ICD-10-CM

## 2024-04-18 DIAGNOSIS — E041 Nontoxic single thyroid nodule: Secondary | ICD-10-CM

## 2024-04-18 LAB — CMP14+EGFR
ALT: 25 IU/L (ref 0–44)
AST: 23 IU/L (ref 0–40)
Albumin: 4.5 g/dL (ref 4.1–5.1)
Alkaline Phosphatase: 68 IU/L (ref 47–123)
BUN/Creatinine Ratio: 14 (ref 9–20)
BUN: 14 mg/dL (ref 6–24)
Bilirubin Total: 0.4 mg/dL (ref 0.0–1.2)
CO2: 24 mmol/L (ref 20–29)
Calcium: 9.5 mg/dL (ref 8.7–10.2)
Chloride: 102 mmol/L (ref 96–106)
Creatinine, Ser: 1.01 mg/dL (ref 0.76–1.27)
Globulin, Total: 2.8 g/dL (ref 1.5–4.5)
Glucose: 162 mg/dL — ABNORMAL HIGH (ref 70–99)
Potassium: 4.1 mmol/L (ref 3.5–5.2)
Sodium: 139 mmol/L (ref 134–144)
Total Protein: 7.3 g/dL (ref 6.0–8.5)
eGFR: 91 mL/min/1.73 (ref >59)

## 2024-04-18 LAB — T3: T3, Total: 117 ng/dL (ref 71–180)

## 2024-04-18 LAB — TSH: TSH: 1.3 u[IU]/mL (ref 0.450–4.500)

## 2024-04-18 LAB — T4, FREE: Free T4: 1.07 ng/dL (ref 0.82–1.77)

## 2024-04-24 ENCOUNTER — Ambulatory Visit (HOSPITAL_COMMUNITY)
Admission: RE | Admit: 2024-04-24 | Discharge: 2024-04-24 | Disposition: A | Discharge status: Home / Self Care | Payer: Self-pay | Source: Ambulatory Visit | Attending: Family Medicine | Admitting: Family Medicine

## 2024-04-24 DIAGNOSIS — R221 Localized swelling, mass and lump, neck: Secondary | ICD-10-CM | POA: Insufficient documentation

## 2024-05-01 ENCOUNTER — Ambulatory Visit (HOSPITAL_COMMUNITY)
Admission: RE | Admit: 2024-05-01 | Discharge: 2024-05-01 | Disposition: A | Discharge status: Home / Self Care | Payer: Self-pay | Source: Ambulatory Visit | Attending: Internal Medicine | Admitting: Internal Medicine

## 2024-05-01 DIAGNOSIS — R590 Localized enlarged lymph nodes: Secondary | ICD-10-CM | POA: Insufficient documentation

## 2024-05-01 MED ORDER — IOHEXOL 300 MG/ML  SOLN
75.0000 mL | Freq: Once | INTRAMUSCULAR | Status: AC | PRN
  Administered 2024-05-01: 75 mL via INTRAVENOUS

## 2024-05-05 ENCOUNTER — Ambulatory Visit: Payer: Self-pay | Admitting: Internal Medicine

## 2024-05-10 ENCOUNTER — Encounter (INDEPENDENT_AMBULATORY_CARE_PROVIDER_SITE_OTHER): Payer: Self-pay

## 2024-05-30 ENCOUNTER — Encounter (INDEPENDENT_AMBULATORY_CARE_PROVIDER_SITE_OTHER): Payer: Self-pay | Admitting: Otolaryngology

## 2024-05-30 ENCOUNTER — Ambulatory Visit (INDEPENDENT_AMBULATORY_CARE_PROVIDER_SITE_OTHER): Payer: Self-pay | Admitting: Otolaryngology

## 2024-05-30 VITALS — BP 124/82 | HR 74 | Temp 98.0°F | Ht 72.0 in | Wt 245.0 lb

## 2024-05-30 DIAGNOSIS — E041 Nontoxic single thyroid nodule: Secondary | ICD-10-CM

## 2024-05-30 NOTE — Progress Notes (Signed)
 Reason for Consult: Lymphadenopathy and thyroid nodule Referring Physician: Dr. Vicci Back [facility] Seth Harris is an 48 y.o. male.  HPI: History of a little bit of discomfort he was having in the right neck along the sternocleidomastoid muscle.  He then had an ultrasound which showed a thyroid nodule.  He then had a follow-up ultrasound at [facility] which showed a nodule that was not indicated for intervention or needle and recommended a year follow-up.  There apparently was a small lymph node noted on the ultrasound.  He then underwent a CT scan which did not show any lymphadenopathy and this was about 1 to 2 weeks later.  He currently has no symptoms except a slight discomfort in his sternocleidomastoid muscle on the right.  That is substantially better from his original complaints.  It is a basically now a nonissue.  No dysphagia or odynophagia.  No hoarseness.  No soreness of his throat.  No other issues with fever, night sweats, or chills.  Past Medical History:  Diagnosis Date   Allergy    Environmental allergies    Hypertension     Past Surgical History:  Procedure Laterality Date   HERNIA REPAIR      Family History  Problem Relation Age of Onset   Diabetes Mother    Diabetes Brother     Social History:  reports that he quit smoking about 22 years ago. His smoking use included cigarettes. He has never used smokeless tobacco. He reports that he does not drink alcohol and does not use drugs.  Allergies: No Known Allergies  Medications: I have reviewed the patient's current medications.  No results found for this or any previous visit (from the past 48 hours).  No results found.  ROS Blood pressure 124/82, pulse 74, temperature 98 F (36.7 C), height 6' (1.829 m), weight 245 lb (111.1 kg), SpO2 95%. Physical Exam Constitutional:      Appearance: Normal appearance.  HENT:     Head: Normocephalic and atraumatic.     Right Ear: Tympanic membrane is without lesions and  middle ear aerated, ear canal and external ear normal.     Left Ear: Tympanic membrane is without lesions and middle ear aerated, ear canal and external ear normal.     Nose: Nose normal. Turbinates with mild hypertrophy, No significant swelling or masses.     Oral cavity/oropharynx: Mucous membranes are moist. No lesions or masses    Larynx: normal voice. Mirror attempted without success    Eyes:     Extraocular Movements: Extraocular movements intact.     Conjunctiva/sclera: Conjunctivae normal.     Pupils: Pupils are equal, round, and reactive to light.  Cardiovascular:     Rate and Rhythm: Normal rate.  Pulmonary:     Effort: Pulmonary effort is normal.  Musculoskeletal:     Cervical back: Normal range of motion and neck supple. No rigidity.  Lymphadenopathy:     Cervical: No cervical adenopathy or masses.salivary glands without lesions. .  Neurological:     Mental Status: He is alert. CN 2-12 intact. No nystagmus      Assessment/Plan: Lymphadenopathy-I do not palpate any adenopathy nor is there any on the CT scan.  I think this is a nonissue at this point and he can follow-up as needed.  Thyroid nodule-he has bilateral nodules that are about 1 cm.  It was recommended the left 1 have a repeat ultrasound in 1 year and that was ordered.  He will follow-up if  he has any further symptoms.  Norleen Notice 05/30/2024, 8:53 AM

## 2024-05-31 ENCOUNTER — Other Ambulatory Visit: Payer: Self-pay

## 2024-05-31 MED ORDER — AMOXICILLIN 875 MG PO TABS
875.0000 mg | ORAL_TABLET | Freq: Two times a day (BID) | ORAL | 0 refills | Status: AC
  Filled 2024-05-31: qty 14, 7d supply, fill #0

## 2024-08-20 ENCOUNTER — Telehealth: Payer: Self-pay | Admitting: Family Medicine

## 2024-08-20 NOTE — Telephone Encounter (Signed)
 Contacted pt left vm to resch appt office closed due to weather please resch appt

## 2024-08-21 ENCOUNTER — Ambulatory Visit: Payer: Self-pay | Admitting: Family Medicine

## 2024-08-21 ENCOUNTER — Other Ambulatory Visit: Payer: Self-pay

## 2024-08-22 ENCOUNTER — Other Ambulatory Visit: Payer: Self-pay

## 2024-09-12 ENCOUNTER — Ambulatory Visit: Payer: Self-pay | Admitting: Nurse Practitioner
